# Patient Record
Sex: Male | Born: 1960 | Race: White | Hispanic: No | Marital: Married | Smoking: Never smoker
Health system: Southern US, Community
[De-identification: ages and names within clinical notes are randomized; demographics above are authoritative.]

## PROBLEM LIST (undated history)

## (undated) DIAGNOSIS — C801 Malignant (primary) neoplasm, unspecified: Secondary | ICD-10-CM

## (undated) DIAGNOSIS — R030 Elevated blood-pressure reading, without diagnosis of hypertension: Secondary | ICD-10-CM

## (undated) DIAGNOSIS — I10 Essential (primary) hypertension: Secondary | ICD-10-CM

## (undated) DIAGNOSIS — C4490 Unspecified malignant neoplasm of skin, unspecified: Secondary | ICD-10-CM

## (undated) HISTORY — DX: Essential (primary) hypertension: I10

## (undated) HISTORY — PX: KNEE ARTHROSCOPY W/ ACL RECONSTRUCTION: SHX1858

## (undated) HISTORY — DX: Unspecified malignant neoplasm of skin, unspecified: C44.90

## (undated) HISTORY — DX: Elevated blood-pressure reading, without diagnosis of hypertension: R03.0

## (undated) HISTORY — PX: ADENOIDECTOMY: SUR15

---

## 2012-02-08 ENCOUNTER — Ambulatory Visit (HOSPITAL_BASED_OUTPATIENT_CLINIC_OR_DEPARTMENT_OTHER)
Admission: RE | Admit: 2012-02-08 | Discharge: 2012-02-08 | Disposition: A | Discharge status: Home / Self Care | Payer: Self-pay | Source: Ambulatory Visit | Attending: Occupational Medicine | Admitting: Occupational Medicine

## 2012-02-08 ENCOUNTER — Other Ambulatory Visit: Payer: Self-pay | Admitting: Occupational Medicine

## 2012-02-08 DIAGNOSIS — Z0000 Encounter for general adult medical examination without abnormal findings: Secondary | ICD-10-CM | POA: Insufficient documentation

## 2013-01-02 DIAGNOSIS — M179 Osteoarthritis of knee, unspecified: Secondary | ICD-10-CM

## 2013-01-02 HISTORY — DX: Osteoarthritis of knee, unspecified: M17.9

## 2013-09-13 ENCOUNTER — Other Ambulatory Visit: Payer: Self-pay | Admitting: Sports Medicine

## 2013-09-13 DIAGNOSIS — M25519 Pain in unspecified shoulder: Secondary | ICD-10-CM

## 2013-09-18 ENCOUNTER — Other Ambulatory Visit: Payer: Self-pay

## 2013-09-19 ENCOUNTER — Other Ambulatory Visit: Payer: Self-pay | Admitting: Sports Medicine

## 2013-09-19 ENCOUNTER — Ambulatory Visit
Admission: RE | Admit: 2013-09-19 | Discharge: 2013-09-19 | Disposition: A | Discharge status: Home / Self Care | Payer: Managed Care, Other (non HMO) | Source: Ambulatory Visit | Attending: Sports Medicine | Admitting: Sports Medicine

## 2013-09-19 DIAGNOSIS — M25519 Pain in unspecified shoulder: Secondary | ICD-10-CM

## 2013-10-20 ENCOUNTER — Other Ambulatory Visit: Payer: Self-pay | Admitting: Sports Medicine

## 2013-10-20 DIAGNOSIS — M25519 Pain in unspecified shoulder: Secondary | ICD-10-CM

## 2013-10-27 ENCOUNTER — Ambulatory Visit
Admission: RE | Admit: 2013-10-27 | Discharge: 2013-10-27 | Disposition: A | Discharge status: Home / Self Care | Payer: Managed Care, Other (non HMO) | Source: Ambulatory Visit | Attending: Sports Medicine | Admitting: Sports Medicine

## 2013-10-27 DIAGNOSIS — M25519 Pain in unspecified shoulder: Secondary | ICD-10-CM

## 2013-10-27 MED ORDER — METHYLPREDNISOLONE ACETATE 40 MG/ML INJ SUSP (RADIOLOG
120.0000 mg | Freq: Once | INTRAMUSCULAR | Status: AC
  Administered 2013-10-27: 120 mg via INTRA_ARTICULAR

## 2013-10-27 MED ORDER — IOHEXOL 180 MG/ML  SOLN
1.0000 mL | Freq: Once | INTRAMUSCULAR | Status: AC | PRN
  Administered 2013-10-27: 1 mL via INTRA_ARTICULAR

## 2014-04-01 ENCOUNTER — Emergency Department (HOSPITAL_BASED_OUTPATIENT_CLINIC_OR_DEPARTMENT_OTHER): Payer: Managed Care, Other (non HMO)

## 2014-04-01 ENCOUNTER — Emergency Department (HOSPITAL_BASED_OUTPATIENT_CLINIC_OR_DEPARTMENT_OTHER)
Admission: EM | Admit: 2014-04-01 | Discharge: 2014-04-01 | Disposition: A | Discharge status: Home / Self Care | Payer: Managed Care, Other (non HMO) | Attending: Emergency Medicine | Admitting: Emergency Medicine

## 2014-04-01 ENCOUNTER — Encounter (HOSPITAL_BASED_OUTPATIENT_CLINIC_OR_DEPARTMENT_OTHER): Payer: Self-pay | Admitting: *Deleted

## 2014-04-01 DIAGNOSIS — M2550 Pain in unspecified joint: Secondary | ICD-10-CM | POA: Diagnosis not present

## 2014-04-01 DIAGNOSIS — R6883 Chills (without fever): Secondary | ICD-10-CM | POA: Diagnosis not present

## 2014-04-01 DIAGNOSIS — R0789 Other chest pain: Secondary | ICD-10-CM | POA: Insufficient documentation

## 2014-04-01 DIAGNOSIS — R42 Dizziness and giddiness: Secondary | ICD-10-CM | POA: Diagnosis not present

## 2014-04-01 DIAGNOSIS — R110 Nausea: Secondary | ICD-10-CM | POA: Insufficient documentation

## 2014-04-01 DIAGNOSIS — R011 Cardiac murmur, unspecified: Secondary | ICD-10-CM | POA: Insufficient documentation

## 2014-04-01 DIAGNOSIS — Z880 Allergy status to penicillin: Secondary | ICD-10-CM | POA: Insufficient documentation

## 2014-04-01 DIAGNOSIS — M546 Pain in thoracic spine: Secondary | ICD-10-CM | POA: Diagnosis not present

## 2014-04-01 DIAGNOSIS — R202 Paresthesia of skin: Secondary | ICD-10-CM | POA: Diagnosis not present

## 2014-04-01 DIAGNOSIS — R079 Chest pain, unspecified: Secondary | ICD-10-CM | POA: Diagnosis present

## 2014-04-01 LAB — URINALYSIS, ROUTINE W REFLEX MICROSCOPIC
Bilirubin Urine: NEGATIVE
Glucose, UA: NEGATIVE mg/dL
HGB URINE DIPSTICK: NEGATIVE
KETONES UR: NEGATIVE mg/dL
Leukocytes, UA: NEGATIVE
Nitrite: NEGATIVE
PH: 5.5 (ref 5.0–8.0)
PROTEIN: NEGATIVE mg/dL
Specific Gravity, Urine: 1.025 (ref 1.005–1.030)
Urobilinogen, UA: 1 mg/dL (ref 0.0–1.0)

## 2014-04-01 LAB — COMPREHENSIVE METABOLIC PANEL
ALT: 15 U/L (ref 0–53)
AST: 21 U/L (ref 0–37)
Albumin: 4.2 g/dL (ref 3.5–5.2)
Alkaline Phosphatase: 59 U/L (ref 39–117)
Anion gap: 16 — ABNORMAL HIGH (ref 5–15)
BUN: 21 mg/dL (ref 6–23)
CALCIUM: 9.3 mg/dL (ref 8.4–10.5)
CHLORIDE: 101 meq/L (ref 96–112)
CO2: 23 mEq/L (ref 19–32)
Creatinine, Ser: 1.1 mg/dL (ref 0.50–1.35)
GFR calc Af Amer: 87 mL/min — ABNORMAL LOW (ref >90)
GFR, EST NON AFRICAN AMERICAN: 75 mL/min — AB (ref >90)
Glucose, Bld: 115 mg/dL — ABNORMAL HIGH (ref 70–99)
Potassium: 4.2 mEq/L (ref 3.7–5.3)
SODIUM: 140 meq/L (ref 137–147)
Total Bilirubin: 0.7 mg/dL (ref 0.3–1.2)
Total Protein: 7.2 g/dL (ref 6.0–8.3)

## 2014-04-01 LAB — CBC
HCT: 41.7 % (ref 39.0–52.0)
HEMOGLOBIN: 14.4 g/dL (ref 13.0–17.0)
MCH: 31.8 pg (ref 26.0–34.0)
MCHC: 34.5 g/dL (ref 30.0–36.0)
MCV: 92.1 fL (ref 78.0–100.0)
PLATELETS: 155 10*3/uL (ref 150–400)
RBC: 4.53 MIL/uL (ref 4.22–5.81)
RDW: 11.9 % (ref 11.5–15.5)
WBC: 5.7 10*3/uL (ref 4.0–10.5)

## 2014-04-01 LAB — TROPONIN I: Troponin I: <0.3 ng/mL (ref <0.30)

## 2014-04-01 MED ORDER — ASPIRIN 81 MG PO CHEW
324.0000 mg | CHEWABLE_TABLET | Freq: Once | ORAL | Status: AC
  Administered 2014-04-01: 324 mg via ORAL
  Filled 2014-04-01: qty 4

## 2014-04-01 MED ORDER — LORAZEPAM 1 MG PO TABS
1.0000 mg | ORAL_TABLET | Freq: Once | ORAL | Status: AC
  Administered 2014-04-01: 1 mg via ORAL
  Filled 2014-04-01: qty 1

## 2014-04-01 NOTE — Discharge Instructions (Signed)
Chest Pain (Nonspecific) Call [facility] tomorrow to schedule the next available office appointment. Tell office staff that you were seen here tonight. It is often hard to give a diagnosis for the cause of chest pain. There is always a chance that your pain could be related to something serious, such as a heart attack or a blood clot in the lungs. You need to follow up with your doctor. HOME CARE  If antibiotic medicine was given, take it as directed by your doctor. Finish the medicine even if you start to feel better.  For the next few days, avoid activities that bring on chest pain. Continue physical activities as told by your doctor.  Do not use any tobacco products. This includes cigarettes, chewing tobacco, and e-cigarettes.  Avoid drinking alcohol.  Only take medicine as told by your doctor.  Follow your doctor's suggestions for more testing if your chest pain does not go away.  Keep all doctor visits you made. GET HELP IF:  Your chest pain does not go away, even after treatment.  You have a rash with blisters on your chest.  You have a fever. GET HELP RIGHT AWAY IF:   You have more pain or pain that spreads to your arm, neck, jaw, back, or belly (abdomen).  You have shortness of breath.  You cough more than usual or cough up blood.  You have very bad back or belly pain.  You feel sick to your stomach (nauseous) or throw up (vomit).  You have very bad weakness.  You pass out (faint).  You have chills. This is an emergency. Do not wait to see if the problems will go away. Call your local emergency services (911 in [place] Do not drive yourself to the hospital. MAKE SURE YOU:   Understand these instructions.  Will watch your condition.  Will get help right away if you are not doing well or get worse. Document Released: 11/04/2007 Document Revised: 05/23/2013 Document Reviewed: 11/04/2007 [facility] Patient Information 2015 [place], [place]. This  information is not intended to replace advice given to you by your health care provider. Make sure you discuss any questions you have with your health care provider.

## 2014-04-01 NOTE — ED Notes (Signed)
Last night began having chest heaviness, pressure that radiates down both arms, tightness in his back. Slight nausea and lightheadedness.

## 2014-04-01 NOTE — ED Notes (Signed)
Patient not yet able to urinate.

## 2014-04-01 NOTE — ED Provider Notes (Signed)
CSN: [id number]     Arrival date & time 04/01/14  1818 History  This chart was scribed for Orlie Dakin, MD by Tula Nakayama, ED Scribe. This patient was seen in room MH10/MH10 and the patient's care was started at 6:50 PM.     Chief Complaint  Patient presents with  . Chest Pain   The history is provided by the patient. No language interpreter was used.    HPI Comments: Benjamin Mccullough is a 53 y.o. male who presents to the Emergency Department complaining of constant, gradually onset chest heaviness and back pain between his shoulder bladeswith tingling in both arms started 10 hours ago. Pt states he lay down and measured BP several times, with readings higher than his baseline ranging around 160/110. He reports nausea, light-headedness, chills, and tingling in his arms as associated symptoms.discomfort is nonexertional pressure-like constant and mild. Pt notes that current symptoms are similar to prior flu symptoms. He tried rest with no relief. 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 He states he is normally active and that he felt more sore than usual after biking yesterday. He felt that all of his muscles ached. Muscle aches worse with movement. Pt has history of heart murmur. He has no family history of CAD <55 y/o and denies cardiac risk factors. Pt denies smoking, drug abuse, and drinking EtOH. He denies fever and SOB as associated symptoms. Cardiac risk factors none History reviewed. No pertinent past medical history. Past Surgical History  Procedure Laterality Date  . Knee arthroscopy w/ acl reconstruction     No family history on file. History  Substance Use Topics  . Smoking status: Never Smoker   . Smokeless tobacco: Not on file  . Alcohol Use: No    Review of Systems  Constitutional: Positive for chills. Negative for fever, diaphoresis, appetite change and fatigue.  HENT: Negative.  Negative for mouth sores, sore throat and trouble  swallowing.   Eyes: Negative for visual disturbance.  Respiratory: Negative.  Negative for cough, chest tightness, shortness of breath and wheezing.   Cardiovascular: Positive for chest pain.  Gastrointestinal: Positive for nausea. Negative for vomiting, abdominal pain, diarrhea and abdominal distention.  Endocrine: Negative for polydipsia, polyphagia and polyuria.  Genitourinary: Negative for dysuria, frequency and hematuria.  Musculoskeletal: Positive for back pain and arthralgias. Negative for gait problem.  Skin: Negative.  Negative for color change, pallor and rash.  Neurological: Positive for light-headedness. Negative for dizziness, syncope and headaches.  Hematological: Does not bruise/bleed easily.  Psychiatric/Behavioral: Negative.  Negative for behavioral problems and confusion.  All other systems reviewed and are negative.     Allergies  Penicillins  Home Medications   Prior to Admission medications   Not on File   BP 158/90 mmHg  Pulse 95  Temp(Src) 99.3 F (37.4 C) (Oral)  Resp 20  Ht 6\' 2"  (1.88 m)  Wt 195 lb (88.451 kg)  BMI 25.03 kg/m2  SpO2 99% Physical Exam  Constitutional: He appears well-developed and well-nourished.  HENT:  Head: Normocephalic and atraumatic.  Eyes: Conjunctivae are normal. Pupils are equal, round, and reactive to light.  Neck: Neck supple. No tracheal deviation present. No thyromegaly present.  Cardiovascular: Normal rate and regular rhythm.   No murmur heard. Pulmonary/Chest: Effort normal and breath sounds normal.  Abdominal: Soft. Bowel sounds are normal. He exhibits no distension. There is no tenderness.  Musculoskeletal: Normal range of motion. He exhibits no edema or tenderness.  Neurological: He is alert. Coordination normal.  Skin: Skin is warm and dry. No rash noted.  Psychiatric: He has a normal mood and affect.  Nursing note and vitals reviewed.   ED Course  Procedures (including critical care time) DIAGNOSTIC  STUDIES: Oxygen Saturation is 99% on RA, normal by my interpretation.    COORDINATION OF CARE: 7:06 PM Discussed treatment plan with pt at bedside and pt agreed to plan.  Labs Review Labs Reviewed - No data to display  Imaging Review No results found.   EKG Interpretation   Date/Time:  Sunday April 01 2014 18:30:10 EST Ventricular Rate:  86 PR Interval:  144 QRS Duration: 100 QT Interval:  378 QTC Calculation: 452 R Axis:   86 Text Interpretation:  Normal sinus rhythm Incomplete right bundle branch  block Borderline ECG No old tracing to compare Confirmed by Winfred Leeds   MD, Shanik Brookshire [phone]) on 04/01/2014 6:46:53 PM     Chest x-ray viewed by me. Results for orders placed or performed during the hospital encounter of 04/01/14  CBC  Result Value Ref Range   WBC 5.7 4.0 - 10.5 K/uL   RBC 4.53 4.22 - 5.81 MIL/uL   Hemoglobin 14.4 13.0 - 17.0 g/dL   HCT 41.7 39.0 - 52.0 %   MCV 92.1 78.0 - 100.0 fL   MCH 31.8 26.0 - 34.0 pg   MCHC 34.5 30.0 - 36.0 g/dL   RDW 11.9 11.5 - 15.5 %   Platelets 155 150 - 400 K/uL  Comprehensive metabolic panel  Result Value Ref Range   Sodium 140 137 - 147 mEq/L   Potassium 4.2 3.7 - 5.3 mEq/L   Chloride 101 96 - 112 mEq/L   CO2 23 19 - 32 mEq/L   Glucose, Bld 115 (H) 70 - 99 mg/dL   BUN 21 6 - 23 mg/dL   Creatinine, Ser 1.10 0.50 - 1.35 mg/dL   Calcium 9.3 8.4 - 10.5 mg/dL   Total Protein 7.2 6.0 - 8.3 g/dL   Albumin 4.2 3.5 - 5.2 g/dL   AST 21 0 - 37 U/L   ALT 15 0 - 53 U/L   Alkaline Phosphatase 59 39 - 117 U/L   Total Bilirubin 0.7 0.3 - 1.2 mg/dL   GFR calc non Af Amer 75 (L) >90 mL/min   GFR calc Af Amer 87 (L) >90 mL/min   Anion gap 16 (H) 5 - 15  Troponin I (order at [facility])  Result Value Ref Range   Troponin I <0.30 <0.30 ng/mL  Urinalysis, Routine w reflex microscopic  Result Value Ref Range   Color, Urine YELLOW YELLOW   APPearance CLEAR CLEAR   Specific Gravity, Urine 1.025 1.005 - 1.030   pH 5.5 5.0 - 8.0   Glucose, UA  NEGATIVE NEGATIVE mg/dL   Hgb urine dipstick NEGATIVE NEGATIVE   Bilirubin Urine NEGATIVE NEGATIVE   Ketones, ur NEGATIVE NEGATIVE mg/dL   Protein, ur NEGATIVE NEGATIVE mg/dL   Urobilinogen, UA 1.0 0.0 - 1.0 mg/dL   Nitrite NEGATIVE NEGATIVE   Leukocytes, UA NEGATIVE NEGATIVE   Dg Chest 2 View  04/01/2014   CLINICAL DATA:  Initial encounter. Chest heaviness. Chest pressure.  EXAM: CHEST  2 VIEW  COMPARISON:  02/08/2012.  FINDINGS: Cardiopericardial silhouette within normal limits. Mediastinal contours normal. Trachea midline. No airspace disease or effusion. Monitoring leads project over the chest. Azygos fissure incidentally noted.  IMPRESSION: No active cardiopulmonary disease.   Electronically Signed   By: Arvilla Market.D.  On: 04/01/2014 19:38    MDM  Heart score equal 1-2 a stone history, age.low pretest clinical suspicion for acute coronary syndrome Hospitalization offer to patient which he declines after lengthy discussion with patient and his wife. Patient is suitable for outpatient cardiac evaluation Final diagnoses:  None   Plan referral [facility] Diagnosis atypical chest pain       Orlie Dakin, MD 04/01/14 2059

## 2014-04-03 NOTE — Progress Notes (Signed)
     HPI: 53 yo male for evaluation of chest pain. Seen in ER 04/01/14 with chest pain; chest xray negative; Hgb, K, renal function, LFTs and troponin negative. Patient is very active. He runs routinely and rides them out and bike. He does not have chest pain with these. 3 days ago he developed chest tightness that lasted all day and radiated to his back. Not pleuritic, positional or related to food. He also had chills, nausea. He went to the emergency room for evaluation with the tests as described above. He ultimately had complete resolution after diarrhea. No chest pain since. He denies dyspnea.  No current outpatient prescriptions on file.   No current facility-administered medications for this visit.    Allergies  Allergen Reactions  . Penicillins      Past Medical History  Diagnosis Date  . Borderline hypertension     Past Surgical History  Procedure Laterality Date  . Knee arthroscopy w/ acl reconstruction    . Adenoidectomy      History   Social History  . Marital Status: Married    Spouse Name: N/A    Number of Children: 2  . Years of Education: N/A   Occupational History  .      Fire department   Social History Main Topics  . Smoking status: Never Smoker   . Smokeless tobacco: Not on file  . Alcohol Use: 0.0 oz/week    0 Not specified per week     Comment: Rare  . Drug Use: No  . Sexual Activity: Not on file   Other Topics Concern  . Not on file   Social History Narrative    Family History  Problem Relation Age of Onset  . CAD Father     CABG in his 37s  . CAD Mother     Stents in her 84s    ROS: Recent chills, nausea and diarrhea that have now resolved. no fevers, productive cough, hemoptysis, dysphasia, odynophagia, melena, hematochezia, dysuria, hematuria, rash, seizure activity, orthopnea, PND, pedal edema, claudication. Remaining systems are negative.  Physical Exam:   Blood pressure 144/88, pulse 76, height 6\' 2"  (1.88 m), weight 204 lb  (92.534 kg).  General:  Well developed/well nourished in NAD Skin warm/dry Patient not depressed No peripheral clubbing Back-normal HEENT-normal/normal eyelids Neck supple/normal carotid upstroke bilaterally; no bruits; no JVD; no thyromegaly chest - CTA/ normal expansion CV - RRR/normal S1 and S2; no murmurs, rubs or gallops;  PMI nondisplaced Abdomen -NT/ND, no HSM, no mass, + bowel sounds, no bruit 2+ femoral pulses, no bruits Ext-no edema, chords, 2+ DP Neuro-grossly nonfocal  ECG 04/01/2014-sinus rhythm, right axis deviation, incomplete right bundle branch block.

## 2014-04-04 ENCOUNTER — Ambulatory Visit (INDEPENDENT_AMBULATORY_CARE_PROVIDER_SITE_OTHER): Payer: Managed Care, Other (non HMO) | Admitting: Cardiology

## 2014-04-04 ENCOUNTER — Encounter: Payer: Self-pay | Admitting: *Deleted

## 2014-04-04 ENCOUNTER — Encounter: Payer: Self-pay | Admitting: Cardiology

## 2014-04-04 VITALS — BP 144/88 | HR 76 | Ht 74.0 in | Wt 204.0 lb

## 2014-04-04 DIAGNOSIS — R079 Chest pain, unspecified: Secondary | ICD-10-CM

## 2014-04-04 DIAGNOSIS — R030 Elevated blood-pressure reading, without diagnosis of hypertension: Secondary | ICD-10-CM

## 2014-04-04 DIAGNOSIS — IMO0001 Reserved for inherently not codable concepts without codable children: Secondary | ICD-10-CM | POA: Insufficient documentation

## 2014-04-04 DIAGNOSIS — R072 Precordial pain: Secondary | ICD-10-CM

## 2014-04-04 HISTORY — DX: Chest pain, unspecified: R07.9

## 2014-04-04 NOTE — Assessment & Plan Note (Signed)
Blood pressure borderline at 144/88. I have asked him to follow this and if it remains elevated he may need medication in the future. Follow-up primary care.

## 2014-04-04 NOTE — Patient Instructions (Signed)
Your physician recommends that you schedule a follow-up appointment in: AS NEEDED PENDING TEST RESULTS  Your physician has requested that you have an echocardiogram. Echocardiography is a painless test that uses sound waves to create images of your heart. It provides your doctor with information about the size and shape of your heart and how well your heart's chambers and valves are working. This procedure takes approximately one hour. There are no restrictions for this procedure.   Your physician has requested that you have an exercise tolerance test. For further information please visit [email or web address]. Please also follow instruction sheet, as given.    Exercise Stress Electrocardiogram An exercise stress electrocardiogram is a test to check how blood flows to your heart. It is done to find areas of poor blood flow. You will need to walk on a treadmill for this test. The electrocardiogram will record your heartbeat when you are at rest and when you are exercising. BEFORE THE PROCEDURE  Do not have drinks with caffeine or foods with caffeine for 24 hours before the test, or as told by your doctor. This includes coffee, tea (even decaf tea), sodas, chocolate, and cocoa.  Follow your doctor's instructions about eating and drinking before the test.  Ask your doctor what medicines you should or should not take before the test. Take your medicines with water unless told by your doctor not to.  If you use an inhaler, bring it with you to the test.  Bring a snack to eat after the test.  Do not  smoke for 4 hours before the test.  Do not put lotions, powders, creams, or oils on your chest before the test.  Wear comfortable shoes and clothing. PROCEDURE  You will have patches put on your chest. Small areas of your chest may need to be shaved. Wires will be connected to the patches.  Your heart rate will be watched while you are resting and while you are exercising.  You will walk on the  treadmill. The treadmill will slowly get faster to raise your heart rate.  The test will take about 1-2 hours. AFTER THE PROCEDURE  Your heart rate and blood pressure will be watched after the test.  You may return to your normal diet, activities, and medicines or as told by your doctor. Document Released: 11/04/2007 Document Revised: 10/02/2013 Document Reviewed: 01/23/2013 [employer] Patient Information 2015 [employer]. This information is not intended to replace advice given to you by your health care provider. Make sure you discuss any questions you have with your health care provider.   

## 2014-04-04 NOTE — Assessment & Plan Note (Signed)
Symptoms are atypical. They have resolved. He may have had a viral syndrome. He is a Airline pilot. I will arrange an exercise treadmill for risk stratification. Echocardiogram to assess wall motion abnormalities.

## 2014-04-20 ENCOUNTER — Telehealth (HOSPITAL_COMMUNITY): Payer: Self-pay

## 2014-04-20 NOTE — Telephone Encounter (Signed)
Encounter complete. 

## 2014-04-25 ENCOUNTER — Ambulatory Visit (HOSPITAL_BASED_OUTPATIENT_CLINIC_OR_DEPARTMENT_OTHER)
Admission: RE | Admit: 2014-04-25 | Discharge: 2014-04-25 | Disposition: A | Discharge status: Home / Self Care | Payer: Managed Care, Other (non HMO) | Source: Ambulatory Visit | Attending: Internal Medicine | Admitting: Internal Medicine

## 2014-04-25 ENCOUNTER — Ambulatory Visit (HOSPITAL_COMMUNITY)
Admission: RE | Admit: 2014-04-25 | Discharge: 2014-04-25 | Disposition: A | Discharge status: Home / Self Care | Payer: Managed Care, Other (non HMO) | Source: Ambulatory Visit | Attending: Internal Medicine | Admitting: Internal Medicine

## 2014-04-25 DIAGNOSIS — R079 Chest pain, unspecified: Secondary | ICD-10-CM

## 2014-04-25 DIAGNOSIS — Z8249 Family history of ischemic heart disease and other diseases of the circulatory system: Secondary | ICD-10-CM | POA: Diagnosis not present

## 2014-04-25 NOTE — Progress Notes (Signed)
2D Echocardiogram Complete.  04/25/2014   Seren Chaloux [place], RDCS

## 2014-04-25 NOTE — Procedures (Signed)
Exercise Treadmill Test  Test  Exercise Tolerance Test Ordering MD: Kirk Ruths, MD    Unique Test No: 1  Treadmill:  1  Indication for ETT: chest pain - rule out ischemia  Contraindication to ETT: No   Stress Modality: exercise - treadmill  Cardiac Imaging Performed: non   Protocol: standard Bruce - maximal  Max BP:  207/77  Max MPHR (bpm):  167 85% MPR (bpm):  141  MPHR obtained (bpm):  169 % MPHR obtained:  101  Reached 85% MPHR (min:sec):  9:40 Total Exercise Time (min-sec):  14  Workload in METS:  17.2 Borg Scale: 15  Reason ETT Terminated:  THR obtained, mild fatigue    ST Segment Analysis At Rest: normal ST segments - no evidence of significant ST depression With Exercise: no evidence of significant ST depression  Other Information Arrhythmia:  No Angina during ETT:  absent (0) Quality of ETT:  diagnostic  ETT Interpretation:  normal - no evidence of ischemia by ST analysis  Comments: Excellent exercise tolerance - 17 METS No evidence for ischemia.  Resting hypertensive with appropriate response to level of exercise.  Pixie Casino, MD, [facility] Attending Cardiologist [place]

## 2017-11-26 DIAGNOSIS — H16041 Marginal corneal ulcer, right eye: Secondary | ICD-10-CM | POA: Diagnosis not present

## 2017-11-29 DIAGNOSIS — H43813 Vitreous degeneration, bilateral: Secondary | ICD-10-CM | POA: Diagnosis not present

## 2017-11-29 DIAGNOSIS — H16041 Marginal corneal ulcer, right eye: Secondary | ICD-10-CM | POA: Diagnosis not present

## 2017-12-14 DIAGNOSIS — H1789 Other corneal scars and opacities: Secondary | ICD-10-CM | POA: Diagnosis not present

## 2018-11-25 DIAGNOSIS — K098 Other cysts of oral region, not elsewhere classified: Secondary | ICD-10-CM | POA: Diagnosis not present

## 2018-12-20 DIAGNOSIS — H1789 Other corneal scars and opacities: Secondary | ICD-10-CM | POA: Diagnosis not present

## 2018-12-20 DIAGNOSIS — H5212 Myopia, left eye: Secondary | ICD-10-CM | POA: Diagnosis not present

## 2019-01-25 DIAGNOSIS — L918 Other hypertrophic disorders of the skin: Secondary | ICD-10-CM | POA: Diagnosis not present

## 2019-01-25 DIAGNOSIS — L821 Other seborrheic keratosis: Secondary | ICD-10-CM | POA: Diagnosis not present

## 2019-01-25 DIAGNOSIS — L814 Other melanin hyperpigmentation: Secondary | ICD-10-CM | POA: Diagnosis not present

## 2019-01-25 DIAGNOSIS — L570 Actinic keratosis: Secondary | ICD-10-CM | POA: Diagnosis not present

## 2019-02-21 DIAGNOSIS — D3705 Neoplasm of uncertain behavior of pharynx: Secondary | ICD-10-CM | POA: Diagnosis not present

## 2019-02-21 DIAGNOSIS — R590 Localized enlarged lymph nodes: Secondary | ICD-10-CM | POA: Diagnosis not present

## 2019-02-21 DIAGNOSIS — J039 Acute tonsillitis, unspecified: Secondary | ICD-10-CM | POA: Diagnosis not present

## 2019-02-21 DIAGNOSIS — D487 Neoplasm of uncertain behavior of other specified sites: Secondary | ICD-10-CM | POA: Diagnosis not present

## 2019-03-03 ENCOUNTER — Other Ambulatory Visit: Payer: Self-pay | Admitting: Otolaryngology

## 2019-03-03 DIAGNOSIS — R221 Localized swelling, mass and lump, neck: Secondary | ICD-10-CM

## 2019-03-10 ENCOUNTER — Ambulatory Visit
Admission: RE | Admit: 2019-03-10 | Discharge: 2019-03-10 | Disposition: A | Discharge status: Home / Self Care | Payer: Managed Care, Other (non HMO) | Source: Ambulatory Visit | Attending: Otolaryngology | Admitting: Otolaryngology

## 2019-03-10 ENCOUNTER — Other Ambulatory Visit: Payer: Self-pay

## 2019-03-10 DIAGNOSIS — R221 Localized swelling, mass and lump, neck: Secondary | ICD-10-CM

## 2019-03-10 MED ORDER — IOPAMIDOL (ISOVUE-300) INJECTION 61%
75.0000 mL | Freq: Once | INTRAVENOUS | Status: AC | PRN
  Administered 2019-03-10: 75 mL via INTRAVENOUS

## 2019-03-14 DIAGNOSIS — D487 Neoplasm of uncertain behavior of other specified sites: Secondary | ICD-10-CM | POA: Diagnosis not present

## 2019-03-14 DIAGNOSIS — D3705 Neoplasm of uncertain behavior of pharynx: Secondary | ICD-10-CM | POA: Diagnosis not present

## 2019-03-14 DIAGNOSIS — R590 Localized enlarged lymph nodes: Secondary | ICD-10-CM | POA: Diagnosis not present

## 2019-03-14 DIAGNOSIS — C760 Malignant neoplasm of head, face and neck: Secondary | ICD-10-CM | POA: Diagnosis not present

## 2019-03-15 ENCOUNTER — Other Ambulatory Visit: Payer: Self-pay | Admitting: Otolaryngology

## 2019-03-15 ENCOUNTER — Other Ambulatory Visit (HOSPITAL_COMMUNITY): Payer: Self-pay | Admitting: Otolaryngology

## 2019-03-15 DIAGNOSIS — C7989 Secondary malignant neoplasm of other specified sites: Secondary | ICD-10-CM

## 2019-03-15 DIAGNOSIS — C801 Malignant (primary) neoplasm, unspecified: Secondary | ICD-10-CM

## 2019-03-16 ENCOUNTER — Encounter: Payer: Self-pay | Admitting: *Deleted

## 2019-03-16 ENCOUNTER — Telehealth: Payer: Self-pay | Admitting: Hematology

## 2019-03-16 NOTE — Progress Notes (Signed)
Received referral for possible metastatic cancer. Message sent to scheduler to schedule with Dr Maylon Peppers tomorrow.   Called [facility] to have p16 added to specimen [id number] DOS 03/14/19.  Spoke to client services and they advised that p16 testing is not possible as they only have smears and they require a cell block for p16 testing.  Dr Maylon Peppers notified.

## 2019-03-16 NOTE — Telephone Encounter (Signed)
Spoke with patient to confirm new patient appt date/time/location 10/16 at 0830 am

## 2019-03-17 ENCOUNTER — Inpatient Hospital Stay: Payer: BC Managed Care – PPO | Attending: Hematology | Admitting: Hematology

## 2019-03-17 ENCOUNTER — Other Ambulatory Visit: Payer: Self-pay

## 2019-03-17 ENCOUNTER — Other Ambulatory Visit: Payer: Self-pay | Admitting: Hematology

## 2019-03-17 ENCOUNTER — Encounter: Payer: Self-pay | Admitting: Hematology

## 2019-03-17 ENCOUNTER — Encounter: Payer: Self-pay | Admitting: *Deleted

## 2019-03-17 ENCOUNTER — Inpatient Hospital Stay: Payer: BC Managed Care – PPO

## 2019-03-17 VITALS — BP 171/111 | HR 80 | Temp 97.1°F | Resp 18 | Ht 74.0 in | Wt 205.0 lb

## 2019-03-17 DIAGNOSIS — C760 Malignant neoplasm of head, face and neck: Secondary | ICD-10-CM

## 2019-03-17 DIAGNOSIS — R1310 Dysphagia, unspecified: Secondary | ICD-10-CM | POA: Diagnosis not present

## 2019-03-17 DIAGNOSIS — E875 Hyperkalemia: Secondary | ICD-10-CM | POA: Insufficient documentation

## 2019-03-17 DIAGNOSIS — R221 Localized swelling, mass and lump, neck: Secondary | ICD-10-CM | POA: Insufficient documentation

## 2019-03-17 DIAGNOSIS — C4442 Squamous cell carcinoma of skin of scalp and neck: Secondary | ICD-10-CM | POA: Insufficient documentation

## 2019-03-17 HISTORY — DX: Squamous cell carcinoma of skin of scalp and neck: C44.42

## 2019-03-17 LAB — CMP (CANCER CENTER ONLY)
ALT: 21 U/L (ref 0–44)
AST: 22 U/L (ref 15–41)
Albumin: 4.9 g/dL (ref 3.5–5.0)
Alkaline Phosphatase: 67 U/L (ref 38–126)
Anion gap: 6 (ref 5–15)
BUN: 17 mg/dL (ref 6–20)
CO2: 28 mmol/L (ref 22–32)
Calcium: 9.9 mg/dL (ref 8.9–10.3)
Chloride: 105 mmol/L (ref 98–111)
Creatinine: 1.16 mg/dL (ref 0.61–1.24)
GFR, Est AFR Am: >60 mL/min (ref >60)
GFR, Estimated: >60 mL/min (ref >60)
Glucose, Bld: 122 mg/dL — ABNORMAL HIGH (ref 70–99)
Potassium: 5.4 mmol/L — ABNORMAL HIGH (ref 3.5–5.1)
Sodium: 139 mmol/L (ref 135–145)
Total Bilirubin: 0.6 mg/dL (ref 0.3–1.2)
Total Protein: 7.6 g/dL (ref 6.5–8.1)

## 2019-03-17 LAB — CBC WITH DIFFERENTIAL (CANCER CENTER ONLY)
Abs Immature Granulocytes: 0 10*3/uL (ref 0.00–0.07)
Basophils Absolute: 0 10*3/uL (ref 0.0–0.1)
Basophils Relative: 0 %
Eosinophils Absolute: 0.1 10*3/uL (ref 0.0–0.5)
Eosinophils Relative: 1 %
HCT: 42.1 % (ref 39.0–52.0)
Hemoglobin: 14.6 g/dL (ref 13.0–17.0)
Immature Granulocytes: 0 %
Lymphocytes Relative: 23 %
Lymphs Abs: 1.1 10*3/uL (ref 0.7–4.0)
MCH: 31 pg (ref 26.0–34.0)
MCHC: 34.7 g/dL (ref 30.0–36.0)
MCV: 89.4 fL (ref 80.0–100.0)
Monocytes Absolute: 0.3 10*3/uL (ref 0.1–1.0)
Monocytes Relative: 7 %
Neutro Abs: 3.4 10*3/uL (ref 1.7–7.7)
Neutrophils Relative %: 69 %
Platelet Count: 214 10*3/uL (ref 150–400)
RBC: 4.71 MIL/uL (ref 4.22–5.81)
RDW: 12.1 % (ref 11.5–15.5)
WBC Count: 4.9 10*3/uL (ref 4.0–10.5)
nRBC: 0 % (ref 0.0–0.2)

## 2019-03-17 MED ORDER — SODIUM POLYSTYRENE SULFONATE 15 GM/60ML PO SUSP: 15.0000 g | Freq: Every day | ORAL | 0 refills | Status: AC

## 2019-03-17 NOTE — Progress Notes (Signed)
Initial RN Navigator Patient Visit  Name: Benjamin Mccullough Date of Referral : 03/16/2019 Diagnosis: Squamous cell carcinoma of head and neck   Met with patient prior to their visit with MD. [name] patient "Your Patient Navigator" handout which explains my role, areas in which I am able to help, and all the contact information for myself and the office. Also gave patient MD and Navigator business card. Reviewed with patient the general overview of expected course after initial diagnosis and time frame for all steps to be completed.  New patient packet given to patient which includes: orientation to office and staff; campus directory; education on My Chart and Advance Directives; and patient centered education on Head and Neck Cancer. Patient doesn't have a PCP. Encouraged patient to establish care with PCP and information given   Patient care will predominantly be scheduled at the [facility] location. [name] notified of patient. Also explained to Benjamin Mccullough that [name] would be navigating him at the [facility] location.   Patient completed visit with Dr. Maylon Peppers  Revisited with patient after MD visit. Patient will need  PET Scan - already scheduled for 03/21/2019 Referral to Radiation Oncology - already initiated and scheduled  Patient understands all follow up procedures and expectations. They have my number to reach out for any further clarification or additional needs.

## 2019-03-17 NOTE — Progress Notes (Signed)
[name] CONSULT NOTE  Patient Care Team: Julianne Handler, MD as PCP - [place] Nakayama, MD as Consulting Physician (Hematology) Eppie Gibson, MD as Attending Physician (Radiation Oncology) Leota Sauers, RN as Oncology Nurse Navigator  HEME/ONC OVERVIEW: 1. Squamous cell carcinoma of left neck, primary unknown  -03/2019:   Left Level II cervical adenopathy, questionable left tonsil abnormality on CT  Left tonsil bx benign; squamous cell Ca from FNA of left cervical LN, insufficient tissue for HPV stain   TREATMENT REGIMEN:  TBD  ASSESSMENT & PLAN:   Squamous cell carcinoma of left neck, primary unknown  -I reviewed the patient's records in detail, including external ENT clinic notes, lab studies, imaging results, pathology reports -I also independently reviewed the radiologic images of recent CT neck, and agree with findings documented -In summary, patient was first seen by Dr. Benjamine Mccullough in 10/2018 for a nodule in the left tonsillar fossa that was noted incidentally during a dental exam.  The decision was made to monitor it closely at that time.  However, over the next 3 months, patient felt that the tonsillar nodule grew larger, and also developed an enlarging left neck mass.  Patient returned to Dr. Benjamine Mccullough in early 03/2019 for further evaluation.   Fiberoptic evaluation of the oral cavity, oropharynx, nasopharynx and larynx was unremarkable.  The small left tonsillar nodule remained unchanged.  CT neck in 03/2019 showed a left Level II LN 3.7 x 3.3cm w/ an additional adjacent LN ~1.4cm without any additional suspicious LN's.  There was some asymmetry in the left palatine tonsil.  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  Unfortunately, there was insufficient tissue for p16 stain.   -I discussed the imaging and pathology reports with the patient -I also reviewed NCCN guidelines at length with the patient -I  discussed with the patient that the treatment for head and neck cancer requires multidisciplinary approach, including ENT, radiation oncology, and medical oncology.  The specific treatment is based on individual disease characteristics, as well as patient's medical comorbidities. -As he does not have a clear primary malignancy, I have ordered PET scan to assess any primary malignancy and rule out metastatic disease (currently scheduled on 03/21/2019) -I briefly discussed with the patient that given the relatively isolated disease in the left cervical LN's, he may be a candidate for upfront surgical resection, and based on the pathology, we can determine if he requires any adjuvant treatment, such as radiation vs chemoradiation  -If he does not have any evidence of ECE on the neck dissection, he may only require adjuvant RT without chemotherapy -I have also requested the case to be reviewed at the H&N tumor board   Hyperkalemia -Likely due to increased dietary intake  -I counseled the patient on the importance of moderation on diet that contains high potassium -I have also prescribed Kayexalate 15g daily x 3 days   Orders Placed This Encounter  Procedures  . CBC with Differential (Cancer Center Only)    Standing Status:   Future    Standing Expiration Date:   04/20/2020  . CMP ([facility] only)    Standing Status:   Future    Standing Expiration Date:   04/20/2020  . Ambulatory referral to Radiation Oncology    Referral Priority:   Routine    Referral Type:   Consultation    Referral Reason:   Specialty Services Required    Requested Specialty:  Radiation Oncology    Number of Visits Requested:   1   All questions were answered. The patient knows to call the clinic with any problems, questions or concerns.  Tentatively return in 2 weeks for labs and clinic follow-up.   Tish Men, MD 03/17/2019 11:09 AM   CHIEF COMPLAINTS/PURPOSE OF CONSULTATION:  "I am doing fine"  HISTORY OF  PRESENTING ILLNESS:  Benjamin Mccullough 57 y.o. 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  Patient was first seen by Dr. Benjamine Mccullough in 10/2018 for a nodule in the left tonsillar fossa that was noted incidentally during a dental exam.  The decision was made to monitor it closely at that time.  However, over the next 3 months, patient felt that the tonsillar nodule grew larger, and also developed an enlarging left neck mass.  Patient returned to Dr. Benjamine Mccullough in early 03/2019 for further evaluation.   Fiberoptic evaluation of the oral cavity, oropharynx, nasopharynx and larynx was unremarkable.  The small left tonsillar nodule remained unchanged.  CT neck in 03/2019 showed a left Level II LN 3.7 x 3.3cm w/ an additional adjacent LN ~1.4cm without any additional suspicious LN's.  There was some asymmetry in the left palatine tonsil.  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  Unfortunately, there was insufficient tissue for p16 stain.    Patient reports that he has occasional dysphagia with liquids, but is otherwise able to eat and drink without any limitation most of the time.  He also reports occasional posterior headache without any associated neurologic deficits, and he attributes to not drinking of caffeine.  He retired as a Airline pilot for 30 years in 2018.  He does not smoke drink or use illicit drugs.  He denies any complaint today.  REVIEW OF SYSTEMS:   Constitutional: ( - ) fevers, ( - )  chills , ( - ) night sweats Eyes: ( - ) blurriness of vision, ( - ) double vision, ( - ) watery eyes Ears, nose, mouth, throat, and face: ( - ) mucositis, ( - ) sore throat Respiratory: ( - ) cough, ( - ) dyspnea, ( - ) wheezes Cardiovascular: ( - ) palpitation, ( - ) chest discomfort, ( - ) lower extremity swelling Gastrointestinal:  ( - ) nausea, ( - ) heartburn, ( - ) change in bowel habits Skin: ( - ) abnormal skin  rashes Lymphatics: ( + ) lymphadenopathy, ( - ) easy bruising Neurological: ( - ) numbness, ( - ) tingling, ( - ) new weaknesses Behavioral/Psych: ( - ) mood change, ( - ) new changes  All other systems were reviewed with the patient and are negative.  I have reviewed his chart and materials related to his cancer extensively and collaborated history with the patient. Summary of oncologic history is as follows: Oncology History   No history exists.    MEDICAL HISTORY:  Past Medical History:  Diagnosis Date  . Borderline hypertension     SURGICAL HISTORY: Past Surgical History:  Procedure Laterality Date  . ADENOIDECTOMY    . KNEE ARTHROSCOPY W/ ACL RECONSTRUCTION      SOCIAL HISTORY: Social History   Socioeconomic History  . Marital status: Married    Spouse name: Not on file  . Number of children: 2  . Years of education: Not on file  . Highest education level: Not on file  Occupational History  Comment: Fire department  Social Needs  . Financial resource strain: Not on file  . Food insecurity    Worry: Not on file    Inability: Not on file  . Transportation needs    Medical: Not on file    Non-medical: Not on file  Tobacco Use  . Smoking status: Never Smoker  . Smokeless tobacco: Never Used  Substance and Sexual Activity  . Alcohol use: Yes    Alcohol/week: 0.0 standard drinks    Comment: Rare  . Drug use: No  . Sexual activity: Not on file  Lifestyle  . Physical activity    Days per week: Not on file    Minutes per session: Not on file  . Stress: Not on file  Relationships  . Social Herbalist on phone: Not on file    Gets together: Not on file    Attends religious service: Not on file    Active member of club or organization: Not on file    Attends meetings of clubs or organizations: Not on file    Relationship status: Not on file  . Intimate partner violence    Fear of current or ex partner: Not on file    Emotionally abused: Not on  file    Physically abused: Not on file    Forced sexual activity: Not on file  Other Topics Concern  . Not on file  Social History Narrative  . Not on file    FAMILY HISTORY: Family History  Problem Relation Age of Onset  . CAD Father        CABG in his 75s  . CAD Mother        Stents in her 79s    ALLERGIES:  is allergic to penicillins.  MEDICATIONS:  Current Outpatient Medications  Medication Sig Dispense Refill  . sodium polystyrene (KAYEXALATE) 15 GM/60ML suspension Take 60 mLs (15 g total) by mouth daily for 3 days. 180 mL 0   No current facility-administered medications for this visit.     PHYSICAL EXAMINATION: ECOG PERFORMANCE STATUS: 0 - Asymptomatic  Vitals:   03/17/19 0919  BP: (!) 171/111  Pulse: 80  Resp: 18  Temp: (!) 97.1 F (36.2 C)  SpO2: 100%   Filed Weights   03/17/19 0919  Weight: 205 lb (93 kg)    GENERAL: alert, no distress and comfortable SKIN: skin color, texture, turgor are normal, no rashes or significant lesions EYES: conjunctiva are pink and non-injected, sclera clear OROPHARYNX: slight left tonsil fullness but no distinct ulceration noted  NECK: supple, non-tender LYMPH:  Left Level II cervical adenopathy, measuring ~4cm LUNGS: clear to auscultation with normal breathing effort HEART: regular rate & rhythm, no murmurs, no lower extremity edema ABDOMEN: soft, non-tender, non-distended, normal bowel sounds Musculoskeletal: no cyanosis of digits and no clubbing  PSYCH: alert & oriented x 3, fluent speech NEURO: no focal motor/sensory deficits  LABORATORY DATA:  I have reviewed the data as listed Lab Results  Component Value Date   WBC 4.9 03/17/2019   HGB 14.6 03/17/2019   HCT 42.1 03/17/2019   MCV 89.4 03/17/2019   PLT 214 03/17/2019   Lab Results  Component Value Date   NA 139 03/17/2019   K 5.4 (H) 03/17/2019   CL 105 03/17/2019   CO2 28 03/17/2019    RADIOGRAPHIC STUDIES: I have personally reviewed the  radiological images as listed and agreed with the findings in the report. Ct Soft Tissue Neck W  Contrast  Result Date: 03/10/2019 CLINICAL DATA:  Neck mass. Additional history provided: Left submandibular swelling that has improved since patient for saw Dr.Teoh. Onset about 5 months ago. Biopsy a peritonsillar mass that was benign. EXAM: CT NECK WITH CONTRAST TECHNIQUE: Multidetector CT imaging of the neck was performed using the standard protocol following the bolus administration of intravenous contrast. CONTRAST:  61mL ISOVUE-300 IOPAMIDOL (ISOVUE-300) INJECTION 61% COMPARISON:  No pertinent prior studies available for comparison. FINDINGS: Pharynx and larynx: There is asymmetric soft tissue prominence in the region of the left palatine tonsil (series 3, image 41). No significant mass or swelling within the nasopharynx, hypopharynx or larynx. Salivary glands: No evidence of inflammation, mass or stone. Thyroid: Negative Lymph nodes: Enlarged left level II lymph node with internal regions of low attenuation, measuring 3.7 x 3.3 cm (series 6, image 71). Immediately posterior to this there is an additional enlarged left level II lymph node which measures 1.4 cm in short axis and demonstrates central low attenuation. No other pathologically enlarged cervical chain lymph nodes are identified. Vascular: The major vascular structures of the neck appear patent. Limited intracranial: Unremarkable Visualized orbits: Excluded from the field of view Mastoids and visualized paranasal sinuses: Small mucous retention cyst within the inferior left maxillary sinus. No significant mastoid effusion Skeleton: Cervical spondylosis. This includes moderate/advanced C5-C6 and C6-C7 disc height loss with small posterior disc osteophytes at this level. Multilevel uncovertebral and facet hypertrophy also greatest at these levels. No acute bony abnormality or suspicious osseous lesion. Upper chest: No consolidation within the imaged lung  apices. Incidentally noted azygos fissure. Ectatic visualized ascending aorta measuring 3.9 cm in diameter. These results will be called to the ordering clinician or representative by the Radiologist Assistant, and communication documented in the PACS or zVision Dashboard. IMPRESSION: 1. Prominent left level II lymphadenopathy with lymph nodes measuring up to 3.7 x 3.3 cm. These nodes demonstrate internal regions of low attenuation and are highly suspicious for nodal metastatic disease. Consider direct tissue sampling for further evaluation. 2. Asymmetric soft tissue prominence in the region of the left palatine tonsil suspicious for possible primary mass given the presence of left level II lymphadenopathy. Correlate with direct visualization. 3. Ectatic visualized ascending aorta measuring 3.9 cm in diameter. Electronically Signed   By: Kellie Simmering   On: 03/10/2019 20:08    PATHOLOGY: I have reviewed the pathology reports as documented in the oncologist history.

## 2019-03-20 ENCOUNTER — Encounter: Payer: Self-pay | Admitting: Radiation Oncology

## 2019-03-20 NOTE — Progress Notes (Signed)
Head and Neck Cancer Location of Tumor / Histology:  Left cervical LN FNA showed squamous cell carcinoma completed by Dr. Benjamine Mola. There was insufficient tissue for p16 stain.   Patient presented with symptoms of: a nodule in the left tonsillar fossa noted during a dental exam in 10/2018. He was referred to Dr. Benjamine Mola who monitored it closely for 3 months until the patient noted the nodule growing larger and he developed an enlarging neck mass.   Biopsy of left cervical lymph node revealed: squamous cell carcinoma.   Nutrition Status Yes No Comments  Weight changes? []  [x]    Swallowing concerns? [x]  []  He reports mild choking on fluids at times.   PEG? []  [x]     Referrals Yes No Comments  Social Work? []  [x]    Dentistry? []  [x]    Swallowing therapy? []  [x]    Nutrition? []  [x]    Med/Onc? [x]  []  Dr. Maylon Peppers 03/17/19   Safety Issues Yes No Comments  Prior radiation? []  [x]    Pacemaker/ICD? []  [x]    Possible current pregnancy? []  [x]    Is the patient on methotrexate? []  [x]     Tobacco/Marijuana/Snuff/ETOH use: He has never smoked. He drinks alcohol rarely.   Past/Anticipated interventions by otolaryngology, if any:  Dr. Benjamine Mola  Past/Anticipated interventions by medical oncology, if any:  Dr. Maylon Peppers 03/17/19    Current Complaints / other details:   PET scheduled for 03/21/19 ordered by Dr. Maylon Peppers.

## 2019-03-21 ENCOUNTER — Telehealth: Payer: Self-pay | Admitting: *Deleted

## 2019-03-21 ENCOUNTER — Encounter
Admission: RE | Admit: 2019-03-21 | Discharge: 2019-03-21 | Disposition: A | Discharge status: Home / Self Care | Payer: BC Managed Care – PPO | Source: Ambulatory Visit | Attending: Otolaryngology | Admitting: Otolaryngology

## 2019-03-21 ENCOUNTER — Encounter: Payer: Self-pay | Admitting: *Deleted

## 2019-03-21 ENCOUNTER — Other Ambulatory Visit: Payer: Self-pay

## 2019-03-21 DIAGNOSIS — C801 Malignant (primary) neoplasm, unspecified: Secondary | ICD-10-CM | POA: Diagnosis not present

## 2019-03-21 DIAGNOSIS — C4442 Squamous cell carcinoma of skin of scalp and neck: Secondary | ICD-10-CM | POA: Diagnosis not present

## 2019-03-21 DIAGNOSIS — C7989 Secondary malignant neoplasm of other specified sites: Secondary | ICD-10-CM

## 2019-03-21 LAB — GLUCOSE, CAPILLARY: Glucose-Capillary: 102 mg/dL — ABNORMAL HIGH (ref 70–99)

## 2019-03-21 MED ORDER — FLUDEOXYGLUCOSE F - 18 (FDG) INJECTION
10.6000 | Freq: Once | INTRAVENOUS | Status: AC | PRN
  Administered 2019-03-21: 10.3 via INTRAVENOUS

## 2019-03-21 NOTE — Telephone Encounter (Signed)
Oncology Nurse Navigator Documentation  Placed introductory call to new referral patient Mr. Benjamin Mccullough.  He met with Dr. Maylon Peppers and [place] 10/16, [facility].  Introduced myself as the H&N oncology nurse navigator that works with Drs. Tillie Fantasia at [facility].  Briefly explained my role as his navigator, provided my contact information.   Confirmed understanding of tomorrow's 9:30/10:00 MyChart video meetings with RN Anderson Malta and Dr. Isidore Moos, voiced understanding I will join him as well.  He indicated his wife will be joining him during appt.  He reports dental condition excellent, has never had cavities, sees dentist q6 months for cleaning.  I explained the purpose of a dental evaluation prior to starting RT, indicated he wd be contacted by [facility] to arrange an appt.    I answered his questions re tmt options. He verbalized understanding of information provided, expressed appreciation for my call.   Navigator Initial Assessment . 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 Currently on FMLA / STD:  No.   . Living Situation:  Lives with wife, 23 yr old son. Marland Kitchen PCP:  No. . PCD:  Yes, Dr. Costella Hatcher . Financial Concerns: no . Transportation Needs: no . Sensory Deficits: no . Language Barriers/Interpreter Needed:  no . Ambulation Needs: no . DME Used in Home: no . Psychosocial Needs:  no . Concerns/Needs Understanding Cancer:  Addressed/answered by navigator to best of ability . Self-Expressed Needs: no

## 2019-03-21 NOTE — Progress Notes (Signed)
Radiation Oncology         [phone] ________________________________  Initial outpatient Consultation by MyChart video during pandemic precautions  Name: Benjamin Mccullough MRN: [id number]  Date: 03/22/2019  DOB: 1960/07/02  [id number], Ishmael Holter, MD  Leta Baptist, MD   REFERRING PHYSICIAN: Leta Baptist, MD  DIAGNOSIS:  Cancer Staging Cancer of tonsillar fossa [facility]) Staging form: Pharynx - HPV-Mediated Oropharynx, AJCC 8th Edition - Clinical stage from 03/22/2019: Stage I (cT2, cN1, cM0, p16: Unknown, HPV: Unknown) - Signed by Eppie Gibson, MD on 03/22/2019    ICD-10-CM   1. 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 Screening for hypothyroidism  Z13.29 TSH   CHIEF COMPLAINT: Here to discuss management of tonsillar cancer  HISTORY OF PRESENT ILLNESS::Benjamin Mccullough is a 58 y.o. male who presented with a nodule in the left tonsillar fossa. He underwent routine dental exam when the nodule was noted incidentally.  Subsequently, the patient saw Dr. Benjamine Mola in 10/2018 who recommended close observation. Unfortunately, over the next 3 months, the patient felt that the tonsillar nodule had grown and he also developed an enlarging left neck mass.   Biopsy of the left tonsil performed on 02/21/2019 showed a benign fibroepithelial polyp.  He underwent neck CT on 03/10/2019, which revealed: prominent left level 2 lymphadenopathy with lymph nodes measuring up to 3.7 cm; asymmetric soft tissue prominence in region of the left palatine tonsil.  Patient proceeded to biopsy of the left neck mass on 03/14/2019, which confirmed malignant cells consistent with squamous cell carcinoma. There was insufficient tissue for p16  staining.  He was referred to Dr. Maylon Peppers on 03/17/2019, who recommended PET scan to determine primary malignancy.  Pertinent imaging thus far includes PET scan performed on 03/21/2019 revealing: hypermetabolic lesion in the left posterior lateral oropharynx localizing to the palatine tonsil region consistent with primary head/neck cancer, activity appears localized to the pharyngeal mucosa; two enlarged and hypermetabolic left level 2 lymph nodes consistent with local nodal metastasis; no evidence of metastatic disease outside head and neck.    Swallowing issues, if any: occasionally difficult, generally okay   Weight Changes: none  Tobacco history, if any: none  ETOH abuse, if any: occasional   He is married.  His wife is present for the consult.  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   PREVIOUS RADIATION THERAPY: No  PAST MEDICAL HISTORY:  has a past medical history of Borderline hypertension.    PAST SURGICAL HISTORY: Past Surgical History:  Procedure Laterality Date   ADENOIDECTOMY     KNEE ARTHROSCOPY W/ ACL RECONSTRUCTION      FAMILY HISTORY: family history includes CAD in his father and mother.  SOCIAL HISTORY:  reports that he has never smoked. He has never used smokeless tobacco. He reports current alcohol use. He reports that he does not use drugs.  ALLERGIES: Penicillins  MEDICATIONS:  Current Outpatient Medications  Medication Sig Dispense Refill   ibuprofen (ADVIL) 200 MG tablet Take 200 mg by mouth every 6 (six) hours as needed.     No current facility-administered medications for this encounter.  REVIEW OF SYSTEMS:  Notable for that above.   PHYSICAL EXAM:  vitals were not taken for this visit.   General: Alert and oriented, in no acute distress Psychiatric: Judgment and insight are intact. Affect is appropriate.   ECOG = 0  0 - Asymptomatic (Fully active, able to carry on all predisease activities without  restriction)  1 - Symptomatic but completely ambulatory (Restricted in physically strenuous activity but ambulatory and able to carry out work of a light or sedentary nature. For example, light housework, office work)  2 - Symptomatic, <50% in bed during the day (Ambulatory and capable of all self care but unable to carry out any work activities. Up and about more than 50% of waking hours)  3 - Symptomatic, >50% in bed, but not bedbound (Capable of only limited self-care, confined to bed or chair 50% or more of waking hours)  4 - Bedbound (Completely disabled. Cannot carry on any self-care. Totally confined to bed or chair)  5 - Death   Eustace Pen MM, Creech RH, Tormey DC, et al. [phone]). "Toxicity and response criteria of the [facility]". [place] Oncol. 5 (6): 649-55   LABORATORY DATA:  Lab Results  Component Value Date   WBC 4.9 03/17/2019   HGB 14.6 03/17/2019   HCT 42.1 03/17/2019   MCV 89.4 03/17/2019   PLT 214 03/17/2019   CMP     Component Value Date/Time   NA 139 03/17/2019 0856   K 5.4 (H) 03/17/2019 0856   CL 105 03/17/2019 0856   CO2 28 03/17/2019 0856   GLUCOSE 122 (H) 03/17/2019 0856   BUN 17 03/17/2019 0856   CREATININE 1.16 03/17/2019 0856   CALCIUM 9.9 03/17/2019 0856   PROT 7.6 03/17/2019 0856   ALBUMIN 4.9 03/17/2019 0856   AST 22 03/17/2019 0856   ALT 21 03/17/2019 0856   ALKPHOS 67 03/17/2019 0856   BILITOT 0.6 03/17/2019 0856   GFRNONAA >60 03/17/2019 0856   GFRAA >60 03/17/2019 0856      No results found for: TSH   RADIOGRAPHY: Ct Soft Tissue Neck W Contrast  Result Date: 03/10/2019 CLINICAL DATA:  Neck mass. Additional history provided: Left submandibular swelling that has improved since patient for saw Dr.Teoh. Onset about 5 months ago. Biopsy a peritonsillar mass that was benign. EXAM: CT NECK WITH CONTRAST TECHNIQUE: Multidetector CT imaging of the neck was performed using the standard protocol following the bolus  administration of intravenous contrast. CONTRAST:  41mL ISOVUE-300 IOPAMIDOL (ISOVUE-300) INJECTION 61% COMPARISON:  No pertinent prior studies available for comparison. FINDINGS: Pharynx and larynx: There is asymmetric soft tissue prominence in the region of the left palatine tonsil (series 3, image 41). No significant mass or swelling within the nasopharynx, hypopharynx or larynx. Salivary glands: No evidence of inflammation, mass or stone. Thyroid: Negative Lymph nodes: Enlarged left level II lymph node with internal regions of low attenuation, measuring 3.7 x 3.3 cm (series 6, image 71). Immediately posterior to this there is an additional enlarged left level II lymph node which measures 1.4 cm in short axis and demonstrates central low attenuation. No other pathologically enlarged cervical chain lymph nodes are identified. Vascular: The major vascular structures of the neck appear patent. Limited intracranial: Unremarkable Visualized orbits: Excluded from the field of view Mastoids and visualized paranasal sinuses: Small mucous retention cyst within the inferior left maxillary sinus. No significant mastoid effusion Skeleton: Cervical spondylosis. This includes moderate/advanced C5-C6 and C6-C7 disc height loss with small posterior  disc osteophytes at this level. Multilevel uncovertebral and facet hypertrophy also greatest at these levels. No acute bony abnormality or suspicious osseous lesion. Upper chest: No consolidation within the imaged lung apices. Incidentally noted azygos fissure. Ectatic visualized ascending aorta measuring 3.9 cm in diameter. These results will be called to the ordering clinician or representative by the Radiologist Assistant, and communication documented in the PACS or zVision Dashboard. IMPRESSION: 1. Prominent left level II lymphadenopathy with lymph nodes measuring up to 3.7 x 3.3 cm. These nodes demonstrate internal regions of low attenuation and are highly suspicious for nodal  metastatic disease. Consider direct tissue sampling for further evaluation. 2. Asymmetric soft tissue prominence in the region of the left palatine tonsil suspicious for possible primary mass given the presence of left level II lymphadenopathy. Correlate with direct visualization. 3. Ectatic visualized ascending aorta measuring 3.9 cm in diameter. Electronically Signed   By: Kellie Simmering   On: 03/10/2019 20:08   Nm Pet Image Initial (pi) Skull Base To Thigh  Result Date: 03/21/2019 CLINICAL DATA:  Initial treatment strategy for squamous cell carcinoma of the neck. EXAM: NUCLEAR MEDICINE PET SKULL BASE TO THIGH TECHNIQUE: 10.3 mCi F-18 FDG was injected intravenously. Full-ring PET imaging was performed from the skull base to thigh after the radiotracer. CT data was obtained and used for attenuation correction and anatomic localization. Fasting blood glucose: 102 mg/dl COMPARISON:  Neck CT 03/10/2019 FINDINGS: Mediastinal blood pool activity: SUV max 2.7 Liver activity: SUV max NA NECK: There is hypermetabolic activity localizing to the mucosal surface of the LEFT posterior oropharynx. This activity appears to involve the palatine tonsil region and is intense with SUV max equal 13.3. Activity extends inferiorly approximately 2 cm into the hypopharynx. Intensely hypermetabolic enlarged LEFT level II lymph node measures 2.3 cm short axis (image 46/3) with SUV max equal 11.4. A slightly more superior and posterior level II lymph node measuring 1 cm short axis (image 38/3) with SUV max equal 7.2. Mild metabolic activity associated with a contralateral RIGHT level II lymph node measuring 7 mm (image 45/3). Activity is low with SUV max equal 2.7 whichis similar to background blood pool activity. Mild activity in the RIGHT posterior lateral oropharynx with SUV max equal 3.9. Incidental CT findings: none CHEST: No hypermetabolic mediastinal or hilar nodes. No suspicious pulmonary nodules on the CT scan. Incidental CT  findings: none ABDOMEN/PELVIS: No abnormal hypermetabolic activity within the liver, pancreas, adrenal glands, or spleen. No hypermetabolic lymph nodes in the abdomen or pelvis. Incidental CT findings: none SKELETON: Choose Incidental CT findings: none IMPRESSION: 1. Hypermetabolic lesion in the LEFT posterior lateral oropharynx localizing to the palatine tonsil region consistent with primary head neck cancer. Activity appears localized to the pharyngeal mucosa. 2. Two enlarged and hypermetabolic LEFT level II lymph nodes consistent local nodal metastasis. 3. Mild activity in the RIGHT posterior lateral oropharynx and mild activity in a normal size RIGHT level II lymph node. Favored benign reactive tissue. 4. No evidence of metastatic disease outside head and neck Electronically Signed   By: Suzy Bouchard M.D.   On: 03/21/2019 11:43      IMPRESSION/PLAN:  This is a delightful patient with head and neck cancer.  He was discussed at our tumor board this morning.  Recommendation was for consideration of transoral robotic surgery.  A referral will be made to [facility] for that.  It is likely he will need adjuvant radiotherapy.  It is less likely he would need adjuvant systemic therapy if  he undergoes this.  Otherwise he is a good candidate for chemoradiotherapy.  This would be an alternative if he does not undergo surgery.  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  He does not have any significant smoking history.  We discussed the potential risks, benefits, and side effects of radiotherapy. We talked in detail about acute and late effects. We discussed that some of the most bothersome acute effects may be mucositis, dysgeusia, salivary changes, skin irritation, hair loss, dehydration, weight loss and fatigue. We talked about late effects which include but are not necessarily limited to dysphagia, hypothyroidism, nerve injury, spinal  cord injury, xerostomia, trismus, and neck edema. No guarantees of treatment were given. A consent form was signed and placed in the patient's medical record. The patient is enthusiastic about proceeding with treatment. I look forward to participating in the patient's care.    We also discussed that the treatment of head and neck cancer is a multidisciplinary process to maximize treatment outcomes and quality of life. For this reasons the following referrals have been or will be made:   Medical oncology to discuss chemotherapy    Dentistry for dental evaluation, possible extractions in the radiation fields, and /or advice on reducing risk of cavities, osteoradionecrosis, or other oral issues.   Nutritionist for nutrition support during and after treatment.   Speech language pathology for swallowing and/or speech therapy.   Social work for social support.    Physical therapy due to risk of lymphedema in neck and deconditioning.   Baseline labs including TSH.  This encounter was provided by telemedicine platform MyChart The patient has given verbal consent for this type of encounter and has been advised to only accept a meeting of this type in a secure network environment. The time spent during this encounter was 30 minutes. The attendants for this meeting include Eppie Gibson  and Otto Herb Gerlich.  During the encounter, Eppie Gibson was located at [facility] Radiation Oncology Department.  Otto Herb Lada was located at home.   __________________________________________   Eppie Gibson, MD   This document serves as a record of services personally performed by Eppie Gibson, MD. It was created on her behalf by Wilburn Mylar, a trained medical scribe. The creation of this record is based on the scribe's personal observations and the provider's statements to them. This document has been checked and approved by the attending provider.

## 2019-03-22 ENCOUNTER — Other Ambulatory Visit: Payer: Self-pay

## 2019-03-22 ENCOUNTER — Encounter: Payer: Self-pay | Admitting: Radiation Oncology

## 2019-03-22 ENCOUNTER — Encounter: Payer: Self-pay | Admitting: *Deleted

## 2019-03-22 ENCOUNTER — Ambulatory Visit
Admission: RE | Admit: 2019-03-22 | Discharge: 2019-03-22 | Disposition: A | Discharge status: Home / Self Care | Payer: BC Managed Care – PPO | Source: Ambulatory Visit | Attending: Radiation Oncology | Admitting: Radiation Oncology

## 2019-03-22 DIAGNOSIS — Z1329 Encounter for screening for other suspected endocrine disorder: Secondary | ICD-10-CM

## 2019-03-22 DIAGNOSIS — C760 Malignant neoplasm of head, face and neck: Secondary | ICD-10-CM

## 2019-03-22 DIAGNOSIS — C770 Secondary and unspecified malignant neoplasm of lymph nodes of head, face and neck: Secondary | ICD-10-CM | POA: Diagnosis not present

## 2019-03-22 DIAGNOSIS — C090 Malignant neoplasm of tonsillar fossa: Secondary | ICD-10-CM

## 2019-03-22 HISTORY — DX: Malignant neoplasm of tonsillar fossa: C09.0

## 2019-03-22 NOTE — Progress Notes (Signed)
Dental Form with Estimates of Radiation Dose      Diagnosis: tonsil cancer, left   ICD-10-CM   1. 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 Screening for hypothyroidism  Z13.29 TSH    Prognosis: good  Anticipated # of fractions: 30-35  Daily?: yes  # of weeks of radiotherapy: 6-7  Chemotherapy?: ?  Anticipated xerostomia:  Mild permanent   Pre-simulation needs:   ? Scatter protection  Simulation: pending TORS decision  Other Notes:   Please contact Eppie Gibson, MD, with patient's disposition after evaluation and/or dental treatment.

## 2019-03-23 ENCOUNTER — Telehealth: Payer: Self-pay | Admitting: *Deleted

## 2019-03-23 ENCOUNTER — Other Ambulatory Visit: Payer: Self-pay

## 2019-03-23 ENCOUNTER — Ambulatory Visit: Payer: BC Managed Care – PPO | Attending: Radiation Oncology | Admitting: Rehabilitation

## 2019-03-23 ENCOUNTER — Telehealth: Payer: Self-pay | Admitting: Hematology

## 2019-03-23 ENCOUNTER — Encounter: Payer: Self-pay | Admitting: Rehabilitation

## 2019-03-23 ENCOUNTER — Encounter (HOSPITAL_COMMUNITY): Payer: BC Managed Care – PPO

## 2019-03-23 ENCOUNTER — Encounter (HOSPITAL_COMMUNITY): Payer: Self-pay | Admitting: Dentistry

## 2019-03-23 ENCOUNTER — Ambulatory Visit (HOSPITAL_COMMUNITY): Payer: Self-pay | Admitting: Dentistry

## 2019-03-23 VITALS — BP 182/115 | HR 84 | Temp 98.7°F

## 2019-03-23 DIAGNOSIS — R293 Abnormal posture: Secondary | ICD-10-CM | POA: Insufficient documentation

## 2019-03-23 DIAGNOSIS — C090 Malignant neoplasm of tonsillar fossa: Secondary | ICD-10-CM

## 2019-03-23 DIAGNOSIS — K030 Excessive attrition of teeth: Secondary | ICD-10-CM

## 2019-03-23 DIAGNOSIS — K0530 Chronic periodontitis, unspecified: Secondary | ICD-10-CM

## 2019-03-23 DIAGNOSIS — Z01818 Encounter for other preprocedural examination: Secondary | ICD-10-CM

## 2019-03-23 DIAGNOSIS — K036 Deposits [accretions] on teeth: Secondary | ICD-10-CM

## 2019-03-23 DIAGNOSIS — K08409 Partial loss of teeth, unspecified cause, unspecified class: Secondary | ICD-10-CM

## 2019-03-23 DIAGNOSIS — K06010 Localized gingival recession, unspecified: Secondary | ICD-10-CM

## 2019-03-23 DIAGNOSIS — K031 Abrasion of teeth: Secondary | ICD-10-CM

## 2019-03-23 DIAGNOSIS — M270 Developmental disorders of jaws: Secondary | ICD-10-CM

## 2019-03-23 DIAGNOSIS — M264 Malocclusion, unspecified: Secondary | ICD-10-CM

## 2019-03-23 MED ORDER — SODIUM FLUORIDE 1.1 % DT CREA: TOPICAL_CREAM | DENTAL | 99 refills | Status: DC

## 2019-03-23 NOTE — Progress Notes (Signed)
DENTAL CONSULTATION  Date of Consultation:  03/23/2019 Patient Name:   Benjamin Mccullough Date of Birth:   [birth year] Medical Record Number: [id number]  COVID 19 SCREENING: The patient does not symptoms concerning for COVID-19 infection (Including fever, chills, cough, or new SHORTNESS OF BREATH).    VITALS: BP (!) 182/127 (BP Location: Right Arm)   Pulse 84   Temp 98.7 F (37.1 C)   CHIEF COMPLAINT: Patient referred by Dr. Isidore Moos for dental consultation.  HPI: Benjamin Mccullough is a 58 year old male referred by Dr. Isidore Moos for a dental consultation.  Patient recently diagnosed with head neck cancer involving the left tonsillar fossa as the primary tumor.  The patient is being referred for possible TORS procedure at [facility] followed by radiation therapy and possible chemotherapy. Patient to consider chemoradiation therapy alone if he does not wish to proceed with surgery.  Patient is now seen as part of a medically necessary prechemoradiation therapy dental protocol examination.  The patient currently denies acute toothaches, swellings, or abscesses.  Patient was last seen in August 2020 for an exam and cleaning with his primary dentist, Dr. Jetty Duhamel.  Patient is usually seen on an every 83-month basis.  Patient denies having any active dental needs.  Patient denies having dental phobia.  Patient denies having partial dentures.  PROBLEM LIST: Patient Active Problem List   Diagnosis Date Noted  . Cancer of tonsillar fossa ([place]) 03/22/2019    Priority: High  . Squamous cell carcinoma of head and neck ([place]) 03/17/2019  . Chest pain 04/04/2014  . Elevated blood pressure 04/04/2014    PMH: Past Medical History:  Diagnosis Date  . Borderline hypertension     PSH: Past Surgical History:  Procedure Laterality Date  . ADENOIDECTOMY    . KNEE ARTHROSCOPY W/ ACL RECONSTRUCTION      ALLERGIES: Allergies  Allergen Reactions  . Penicillins     MEDICATIONS: Current  Outpatient Medications  Medication Sig Dispense Refill  . ibuprofen (ADVIL) 200 MG tablet Take 200 mg by mouth every 6 (six) hours as needed.     No current facility-administered medications for this visit.     LABS: Lab Results  Component Value Date   WBC 4.9 03/17/2019   HGB 14.6 03/17/2019   HCT 42.1 03/17/2019   MCV 89.4 03/17/2019   PLT 214 03/17/2019      Component Value Date/Time   NA 139 03/17/2019 0856   K 5.4 (H) 03/17/2019 0856   CL 105 03/17/2019 0856   CO2 28 03/17/2019 0856   GLUCOSE 122 (H) 03/17/2019 0856   BUN 17 03/17/2019 0856   CREATININE 1.16 03/17/2019 0856   CALCIUM 9.9 03/17/2019 0856   GFRNONAA >60 03/17/2019 0856   GFRAA >60 03/17/2019 0856   No results found for: INR, PROTIME No results found for: PTT  SOCIAL HISTORY: Social History   Socioeconomic History  . Marital status: Married    Spouse name: Not on file  . Number of children: 2  . Years of education: Not on file  . Highest education level: Not on file  Occupational History    Comment: Fire department  Social Needs  . Financial resource strain: Not on file  . Food insecurity    Worry: Not on file    Inability: Not on file  . Transportation needs    Medical: No    Non-medical: No  Tobacco Use  . Smoking status: Never Smoker  . Smokeless tobacco: Never Used  Substance and Sexual  Activity  . Alcohol use: Yes    Alcohol/week: 0.0 standard drinks    Comment: Rare  . Drug use: No  . Sexual activity: Not on file  Lifestyle  . Physical activity    Days per week: Not on file    Minutes per session: Not on file  . Stress: Not on file  Relationships  . Social Herbalist on phone: Not on file    Gets together: Not on file    Attends religious service: Not on file    Active member of club or organization: Not on file    Attends meetings of clubs or organizations: Not on file    Relationship status: Not on file  . Intimate partner violence    Fear of current or ex  partner: No    Emotionally abused: No    Physically abused: No    Forced sexual activity: No  Other Topics Concern  . Not on file  Social History Narrative  . Not on file    FAMILY HISTORY: Family History  Problem Relation Age of Onset  . CAD Father        CABG in his 65s  . CAD Mother        Stents in her 51s    REVIEW OF SYSTEMS: Reviewed with the patient as per History of present illness. Psych: Patient denies having dental phobia.  DENTAL HISTORY: CHIEF COMPLAINT: Patient referred by Dr. Isidore Moos for dental consultation.  HPI: Benjamin Mccullough is a 58 year old male referred by Dr. Isidore Moos for a dental consultation.  Patient recently diagnosed with head neck cancer involving the left tonsillar fossa as the primary tumor.  The patient is being referred for possible TORS procedure at [facility] followed by radiation therapy and possible chemotherapy. Patient to consider chemoradiation therapy alone if he does not wish to proceed with surgery.  Patient is now seen as part of a medically necessary prechemoradiation therapy dental protocol examination.  The patient currently denies acute toothaches, swellings, or abscesses.  Patient was last seen in August 2020 for an exam and cleaning with his primary dentist, Dr. Jetty Duhamel.  Patient is usually seen on an every 55-month basis.  Patient denies having any active dental needs.  Patient denies having dental phobia.  Patient denies having partial dentures.   DENTAL EXAMINATION: GENERAL: The patient is a well-developed, well-nourished male in no acute distress. HEAD AND NECK: The patient has left neck lymphadenopathy.  I do not palpate any right neck lymphadenopathy.  The patient denies acute TMJ symptoms.   INTRAORAL EXAM: Patient has normal saliva.  Patient's maximum interincisal opening is 50 mm.  There is evidence of maxillary and mandibular anterior incisal attrition. DENTITION: Patient is missing only tooth #1.  Tooth numbers  16, 17, and 32 are erupted into the oral cavity. PERIODONTAL: The patient has chronic periodontitis with plaque and calculs accumulations, selective areas of gingival recession and incipient bone loss.  There is no significant tooth mobility. DENTAL CARIES/SUBOPTIMAL RESTORATIONS: No obvious dental caries are noted.  Multiple flexure lesions are noted. ENDODONTIC: The patient denies any acute pulpitis symptoms.  I do not see any evidence of periapical pathology or radiolucency. CROWN AND BRIDGE: There are no crown restorations noted. PROSTHODONTIC: There are no partial dentures. OCCLUSION: Patient has a poor occlusal scheme but a stable occlusion.  RADIOGRAPHIC INTERPRETATION: An orthopantogram was taken and supplemented with a full series of dental radiographs. There is missing tooth #1.  Tooth #'s  16,17 and 32 are fully erupted.  There is incipient bone loss. There are no obvious periapical radiolucencies.  There are no obvious dental caries noted.  Excessive maxillary and mandibular anterior attrition is noted.   ASSESSMENTS: 1.  Head neck cancer with left tonsillar fossa primary tumor 2.  Anticipated TORS procedure followed by radiation therapy and possible chemotherapy 3.  Prechemoradiation therapy dental protocol 4.  Chronic periodontitis with incipient bone loss 5.  Gingival recession 6.  Accretions-minimal 7.  Missing tooth #1 8.  Maxillary and mandibular anterior incisal attrition  9.  Lower anterior crowding 10.  Multiple flexure lesions 11.  Bilateral small mandibular lingual tori 12.  Poor occlusal scheme but a stable occlusion   PLAN/RECOMMENDATIONS: 1. I discussed the risks, benefits, and complications of various treatment options with the patient in relationship to his medical and dental conditions, probable TORS procedure followed radiation therapy +/-chemotherapy or consideration for primary chemoradiation therapy only.  We discussed various treatment options to include  no treatment, extraction of remaining wisdom teeth numbers 16, 17, and 32, pre-prosthetic surgery as indicated, periodontal therapy, dental restorations, root canal therapy, crown and bridge therapy, implant therapy, and replacement of missing teeth as indicated.  The patient currently wishes to defer any dental treatment at this time.  A prescription for Prevident 5000 fluoride toothpaste will be sent to [place] for the patient touse at bedtime.  Refills for 1 year will be provided.  Patient is currently cleared for TORS procedure and chemoradiation therapy.   2. Discussion of findings with medical team and coordination of future medical and dental care as needed.  I spent in excess of  90 minutes during the conduct of this consultation and >50% of this time involved direct face-to-face encounter for counseling and/or coordination of the patient's care.    Lenn Cal, DDS

## 2019-03-23 NOTE — Patient Instructions (Signed)
discussed head and neck packet exercises and postural considerations, radiation side effects and importance of staying fit and active, walking during treatment

## 2019-03-23 NOTE — Telephone Encounter (Signed)
Scheduled appt per 10/21 sch message - pt aware of appt date and time

## 2019-03-23 NOTE — Therapy (Signed)
[place], [place], [address] Phone: [phone]   Fax:  [phone]  Physical Therapy Evaluation  Patient Details  Name: Benjamin Mccullough MRN: [id number] Date of Birth: April 25, 1961 Referring Provider (PT): Dr. Isidore Moos   Encounter Date: 03/23/2019  PT End of Session - 03/23/19 1453    Visit Number  1    Number of Visits  1    PT Start Time  1406    PT Stop Time  1438    PT Time Calculation (min)  32 min    Activity Tolerance  Patient tolerated treatment well    Behavior During Therapy  [facility] for tasks assessed/performed       Past Medical History:  Diagnosis Date  . Borderline hypertension     Past Surgical History:  Procedure Laterality Date  . ADENOIDECTOMY    . KNEE ARTHROSCOPY W/ ACL RECONSTRUCTION      There were no vitals filed for this visit.   Subjective Assessment - 03/23/19 1407    Subjective  hear to get information before surgery    Pertinent History  CT neck in 03/2019 showed a left Level II LN 3.7 x 3.3cm w/ an additional adjacent LN ~1.4cm without any additional suspicious LN's.  There was some asymmetry in the left palatine tonsil.  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 The patient being referred for possible T ORS procedure at [facility] followed by radiation therapy and possible chemotherapy.  Patient to consider chemoradiation therapy if he does not wish to proceed with surgery.  Other history includes: left ACL replacement    Limitations  --   none   Patient Stated Goals  get information from providers    Currently in Pain?  No/denies         [facility] PT Assessment - 03/23/19 0001      Assessment   Medical Diagnosis  SCC of the left tonsil    Referring Provider (PT)  Dr. Isidore Moos    Hand Dominance  Right    Prior Therapy  no      Precautions   Precaution Comments  none      Restrictions   Weight  Bearing Restrictions  No    Other Position/Activity Restrictions  no      Balance Screen   Has the patient fallen in the past 6 months  No    Has the patient had a decrease in activity level because of a fear of falling?   No    Is the patient reluctant to leave their home because of a fear of falling?   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    Leisure  trail running, [employer] lifting      Cognition   Overall Cognitive Status  Within Functional Limits for tasks assessed      Observation/Other Assessments   Observations  very in shape active individual      Sensation   Light Touch  Appears Intact      Coordination   Gross  Motor Movements are Fluid and Coordinated  Yes      Posture/Postural Control   Posture/Postural Control  Postural limitations    Postural Limitations  Rounded Shoulders;Forward head      ROM / Strength   AROM / PROM / Strength  AROM      AROM   Overall AROM Comments  bil shoulder AROM WNL and painfree    AROM Assessment Site  Shoulder;Cervical    Right/Left Shoulder  Right;Left    Cervical Flexion  56    Cervical Extension  70    Cervical - Right Side Bend  38    Cervical - Left Side Bend  45    Cervical - Right Rotation  70    Cervical - Left Rotation  70        LYMPHEDEMA/ONCOLOGY QUESTIONNAIRE - 03/23/19 1424      Type   Cancer Type  SCC of the left tonsil      Lymphedema Assessments   Lymphedema Assessments  Head and Neck      Head and Neck   Right Lateral Nostril at base of nose to medial tragus   11.5 cm    Left Lateral Nostril at base of nose to medial tragus   10.5 cm    Right Corner of mouth to where ear lobe meets face  10.2 cm    Left Corner of  mouth to where ear lobe meets face  10.1 cm    4 cm superior to sternal notch around neck  41.5 cm    6 cm superior to sternal notch around neck  41 cm    8 cm superior to sternal notch around neck  40.7 cm             Objective measurements completed on examination: See above findings.      [facility] Adult PT Treatment/Exercise - 03/23/19 0001      Self-Care   Self-Care  Other Self-Care Comments    Other Self-Care Comments   discussed head and neck packet exercises and postural considerations, radiation side effects and importance of staying fit and active, walking during treatment      Exercises   Exercises  Other Exercises             PT Education - 03/23/19 1452    Education Details  discussed head and neck packet exercises and postural considerations, radiation side effects and importance of staying fit and active, walking during treatment    Person(s) Educated  Patient    Methods  Explanation;Demonstration;Tactile cues;Verbal cues;Handout    Comprehension  Verbalized understanding;Returned demonstration;Verbal cues required              Head and Neck Clinic Goals - 03/23/19 1455      Patient will be able to verbalize understanding of a home exercise program for cervical range of motion, posture, and walking.    Status  Achieved      Patient will be able to verbalize understanding of proper sitting and standing posture.    Status  Achieved      Patient will be able to verbalize understanding of lymphedema risk and availability of treatment for this condition.    Status  Achieved         Plan - 03/23/19 1453    Clinical Impression Statement  Pt presents with new diagnosis of left sided tonsil SCC with 2 involved lymph nodes.  Most likely TORs procedure and radiation but pt has not met with  the surgeon yet.  Took baseline measurements and educated pt on post op stretches, importance of walking throughout radiation, and how to monitor  for lymphedema.    Stability/Clinical Decision Making  Stable/Uncomplicated    Clinical Decision Making  Low    Rehab Potential  Excellent    PT Frequency  One time visit    PT Treatment/Interventions  ADLs/Self Care Home Management    PT Next Visit Plan  reassess if returns    Consulted and Agree with Plan of Care  Patient       Patient will benefit from skilled therapeutic intervention in order to improve the following deficits and impairments:  Decreased knowledge of precautions  Visit Diagnosis: Abnormal posture     Problem List Patient Active Problem List   Diagnosis Date Noted  . Cancer of tonsillar fossa ([place]) 03/22/2019  . 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 Chest pain 04/04/2014  . Elevated blood pressure 04/04/2014    Stark Bray 03/23/2019, 2:55 PM  [place], [place], [address] Phone: [phone]   Fax:  [phone]  Name: Aidon Klemens MRN: [id number] Date of Birth: [birth year]

## 2019-03-23 NOTE — Patient Instructions (Signed)

## 2019-03-23 NOTE — Telephone Encounter (Signed)
CALLED PATIENT TO INFORM OF APPT. WITH JOSHUA WALTONEN ON 04-05-19 - ARRIVAL TIME- 9:15 AM @ [facility] IN [place], SPOKE WITH PATIENT AND HE IS AWARE OF THIS APPT.

## 2019-03-24 NOTE — Progress Notes (Signed)
A user error has taken place: encounter opened in error, closed for administrative reasons.

## 2019-03-28 ENCOUNTER — Emergency Department (HOSPITAL_BASED_OUTPATIENT_CLINIC_OR_DEPARTMENT_OTHER): Payer: BC Managed Care – PPO

## 2019-03-28 ENCOUNTER — Other Ambulatory Visit: Payer: Self-pay

## 2019-03-28 ENCOUNTER — Encounter (HOSPITAL_BASED_OUTPATIENT_CLINIC_OR_DEPARTMENT_OTHER): Payer: Self-pay

## 2019-03-28 ENCOUNTER — Emergency Department (HOSPITAL_BASED_OUTPATIENT_CLINIC_OR_DEPARTMENT_OTHER)
Admission: EM | Admit: 2019-03-28 | Discharge: 2019-03-28 | Disposition: A | Discharge status: Home / Self Care | Payer: BC Managed Care – PPO | Attending: Emergency Medicine | Admitting: Emergency Medicine

## 2019-03-28 DIAGNOSIS — R42 Dizziness and giddiness: Secondary | ICD-10-CM | POA: Insufficient documentation

## 2019-03-28 DIAGNOSIS — Z880 Allergy status to penicillin: Secondary | ICD-10-CM | POA: Diagnosis not present

## 2019-03-28 DIAGNOSIS — C090 Malignant neoplasm of tonsillar fossa: Secondary | ICD-10-CM | POA: Diagnosis not present

## 2019-03-28 DIAGNOSIS — Z79899 Other long term (current) drug therapy: Secondary | ICD-10-CM | POA: Diagnosis not present

## 2019-03-28 DIAGNOSIS — I10 Essential (primary) hypertension: Secondary | ICD-10-CM | POA: Diagnosis not present

## 2019-03-28 DIAGNOSIS — L570 Actinic keratosis: Secondary | ICD-10-CM | POA: Diagnosis not present

## 2019-03-28 DIAGNOSIS — R519 Headache, unspecified: Secondary | ICD-10-CM | POA: Diagnosis not present

## 2019-03-28 HISTORY — DX: Malignant (primary) neoplasm, unspecified: C80.1

## 2019-03-28 LAB — CBC
HCT: 41.6 % (ref 39.0–52.0)
Hemoglobin: 14.5 g/dL (ref 13.0–17.0)
MCH: 31.3 pg (ref 26.0–34.0)
MCHC: 34.9 g/dL (ref 30.0–36.0)
MCV: 89.7 fL (ref 80.0–100.0)
Platelets: 206 10*3/uL (ref 150–400)
RBC: 4.64 MIL/uL (ref 4.22–5.81)
RDW: 12.3 % (ref 11.5–15.5)
WBC: 4.9 10*3/uL (ref 4.0–10.5)
nRBC: 0 % (ref 0.0–0.2)

## 2019-03-28 LAB — BASIC METABOLIC PANEL
Anion gap: 8 (ref 5–15)
BUN: 13 mg/dL (ref 6–20)
CO2: 27 mmol/L (ref 22–32)
Calcium: 9.4 mg/dL (ref 8.9–10.3)
Chloride: 104 mmol/L (ref 98–111)
Creatinine, Ser: 0.88 mg/dL (ref 0.61–1.24)
GFR calc Af Amer: >60 mL/min (ref >60)
GFR calc non Af Amer: >60 mL/min (ref >60)
Glucose, Bld: 108 mg/dL — ABNORMAL HIGH (ref 70–99)
Potassium: 3.6 mmol/L (ref 3.5–5.1)
Sodium: 139 mmol/L (ref 135–145)

## 2019-03-28 MED ORDER — DIPHENHYDRAMINE HCL 50 MG/ML IJ SOLN
25.0000 mg | Freq: Once | INTRAMUSCULAR | Status: AC
  Administered 2019-03-28: 18:00:00 25 mg via INTRAVENOUS
  Filled 2019-03-28: qty 1

## 2019-03-28 MED ORDER — PROCHLORPERAZINE EDISYLATE 10 MG/2ML IJ SOLN
10.0000 mg | Freq: Once | INTRAMUSCULAR | Status: AC
  Administered 2019-03-28: 10 mg via INTRAVENOUS
  Filled 2019-03-28: qty 2

## 2019-03-28 MED ORDER — SODIUM CHLORIDE 0.9 % IV BOLUS
1000.0000 mL | Freq: Once | INTRAVENOUS | Status: AC
  Administered 2019-03-28: 1000 mL via INTRAVENOUS

## 2019-03-28 MED ORDER — HYDROCHLOROTHIAZIDE 25 MG PO TABS: 25.0000 mg | ORAL_TABLET | Freq: Every day | ORAL | 0 refills | Status: DC

## 2019-03-28 MED ORDER — LABETALOL HCL 5 MG/ML IV SOLN
10.0000 mg | Freq: Once | INTRAVENOUS | Status: AC
  Administered 2019-03-28: 18:00:00 10 mg via INTRAVENOUS
  Filled 2019-03-28: qty 4

## 2019-03-28 MED ORDER — KETOROLAC TROMETHAMINE 15 MG/ML IJ SOLN
15.0000 mg | Freq: Once | INTRAMUSCULAR | Status: AC
  Administered 2019-03-28: 15 mg via INTRAVENOUS
  Filled 2019-03-28: qty 1

## 2019-03-28 NOTE — Discharge Instructions (Addendum)
Start taking blood pressure medication daily in the morning, continue to monitor your blood pressures and keep a log to share with your primary care doctor I would like for you to follow-up for continued blood pressure management in about 1 week.  Your head CT was reassuring and I am glad that your headache is improved.  Return for new or worsening headache associated vision changes, dizziness, numbness, weakness or any other new or concerning symptoms.

## 2019-03-28 NOTE — Progress Notes (Signed)
Oncology Nurse Navigator Documentation  Met with Benjamin Mccullough during initial WebEx consult with Dr. Isidore Moos.  He was joined by his wife.   . Further introduced myself as his Navigator, explained my role as a member of the Care Team.   . Provided introductory explanation of radiation treatment including SIM planning and purpose of Aquaplast head and shoulder mask, showed them example.  Marland Kitchen He voiced understanding of: Marland Kitchen TB recommendation for TORS, voiced understanding Dr. Isidore Moos to place referral to [facility] for surgical consult.  Marland Kitchen Pending referrals to PT, SLP, Nutrition and SW; value of PT and SLP prior to TORS.  They verbalized understanding of information provided.   They agreed to call me with questions/concerns as appts/treatments/procedures begin.   [name], RN, BSN Head & Neck Oncology Nurse [place] at [place] [phone]

## 2019-03-28 NOTE — ED Triage Notes (Signed)
Pt c/o intermittent HA -recent dx of "neck cancer"-states he has had multiple MD visits with some elevated BP-no meds started-dizziness and nausea today ~2pm-took motrin x 2 PTA no relief-NAD-steady gait

## 2019-03-28 NOTE — ED Provider Notes (Signed)
[facility] EMERGENCY DEPARTMENT Provider Note   CSN: [id number] Arrival date & time: 03/28/19  1643     History   Chief Complaint Chief Complaint  Patient presents with  . Headache    HPI Benjamin Mccullough is a 58 y.o. male.     Benjamin Mccullough is a 58 y.o. 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  Patient reports that in the past he has had elevated blood pressures intermittently but has never been on consistent medication for this over the past few days patient has noticed higher than usual blood pressure at recent doctor's appointments has been a bit elevated but he has not been started on any medications.  Today when he woke up his blood pressure was the highest he has ever seen systolic in the [id number] and diastolic over [id number], initially he was not symptomatic with this but between 1 PM and 1:30 he developed a sudden onset headache which he describes as severe.  He reports it started as a constant throbbing ache it has now settled in the occipital region of the head and is a dull constant ache.  He denies associated visual changes.  No associated dizziness or lightheadedness.  He did have some nausea when headache became severe but no episodes of vomiting.  No numbness tingling or weakness.  No difficulty walking.  He denies any associated chest pain or shortness of breath.  No lower extremity swelling.  No other aggravating or alleviating factors.  Patient was recently diagnosed with left-sided tonsillar cancer and is planning to have surgery soon with associated radiation that they hope will be curative.  He is not on any chemotherapy or new medications.     Past Medical History:  Diagnosis Date  . Borderline hypertension   . Cancer [facility])     Patient Active Problem List   Diagnosis Date Noted  . Cancer of  tonsillar fossa ([place]) 03/22/2019  . 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 Chest pain 04/04/2014  . Elevated blood pressure 04/04/2014    Past Surgical History:  Procedure Laterality Date  . ADENOIDECTOMY    . KNEE ARTHROSCOPY W/ ACL RECONSTRUCTION          Home Medications    Prior to Admission medications   Medication Sig Start Date End Date Taking? Authorizing Provider  ibuprofen (ADVIL) 200 MG tablet Take 200 mg by mouth every 6 (six) hours as needed.    [provider]  sodium fluoride (PREVIDENT 5000 PLUS) 1.1 % CREA dental cream Apply to tooth brush. Brush teeth for 2 minutes. Spit out excess-DO NOT swallow. DO NOT rinse afterwards. Repeat nightly. 03/23/19   Lenn Cal, DDS    Family History Family History  Problem Relation Age of Onset  . CAD Father        CABG in his 42s  . CAD Mother        Stents in her 69s    Social History Social History   Tobacco Use  . Smoking status: Never Smoker  . Smokeless tobacco: Never Used  Substance Use Topics  . Alcohol use: Not Currently    Alcohol/week: 0.0 standard drinks  . Drug use: No     Allergies   Penicillins   Review of Systems Review of Systems  Constitutional: Negative for chills and fever.  HENT: Negative.   Eyes: Negative for photophobia and visual disturbance.  Respiratory: Negative for cough, chest tightness and shortness of breath.   Cardiovascular: Negative for chest pain, palpitations and leg swelling.  Gastrointestinal: Positive for nausea. Negative for abdominal pain, constipation, diarrhea and vomiting.  Genitourinary: Negative for dysuria.  Musculoskeletal: Negative for neck pain and neck stiffness.  Skin: Negative for color change and rash.  Neurological: Positive for headaches. Negative for dizziness, syncope, facial asymmetry, speech difficulty, weakness, light-headedness and numbness.     Physical Exam Updated Vital Signs BP (!) 185/128 (BP  Location: Left Arm)   Pulse 78   Temp 98.6 F (37 C) (Oral)   Resp 18   Ht 6\' 2"  (1.88 m)   Wt 94.3 kg   SpO2 98%   BMI 26.71 kg/m   Physical Exam Vitals signs and nursing note reviewed.  Constitutional:      General: He is not in acute distress.    Appearance: He is well-developed and normal weight. He is not ill-appearing or diaphoretic.  HENT:     Head: Normocephalic and atraumatic.     Mouth/Throat:     Mouth: Mucous membranes are moist.     Pharynx: Oropharynx is clear.  Eyes:     General:        Right eye: No discharge.        Left eye: No discharge.     Extraocular Movements: Extraocular movements intact.     Left eye: No nystagmus.     Pupils: Pupils are equal, round, and reactive to light.  Neck:     Musculoskeletal: Neck supple. No neck rigidity.     Meningeal: Kernig's sign absent.  Cardiovascular:     Rate and Rhythm: Normal rate and regular rhythm.     Heart sounds: Normal heart sounds.  Pulmonary:     Effort: Pulmonary effort is normal. No respiratory distress.     Breath sounds: Normal breath sounds. No wheezing or rales.     Comments: Respirations equal and unlabored, patient able to speak in full sentences, lungs clear to auscultation bilaterally Abdominal:     General: Bowel sounds are normal. There is no distension.     Palpations: Abdomen is soft. There is no mass.     Tenderness: There is no abdominal tenderness. There is no guarding.     Comments: Abdomen soft, nondistended, nontender to palpation in all quadrants without guarding or peritoneal signs  Musculoskeletal:        General: No deformity.  Skin:    General: Skin is warm and dry.     Capillary Refill: Capillary refill takes less than 2 seconds.  Neurological:     Mental Status: He is alert.     GCS: GCS eye subscore is 4. GCS verbal subscore is 5. GCS motor subscore is 6.     Coordination: Coordination normal.     Comments: Speech is clear, able to follow commands CN III-XII intact  Normal strength in upper and lower extremities bilaterally including dorsiflexion and plantar flexion, strong and equal grip strength Sensation normal to light and sharp touch Moves extremities without ataxia, coordination intact Normal finger to nose and rapid alternating movements No pronator drift  Psychiatric:        Mood and Affect: Mood normal.        Behavior: Behavior normal.      ED Treatments / Results  Labs (all labs ordered are listed, but only abnormal results are displayed) Labs Reviewed  BASIC METABOLIC PANEL - Abnormal; Notable for the following components:      Result Value   Glucose, Bld 108 (*)    All other components within normal limits  CBC    EKG EKG Interpretation  Date/Time:  Tuesday March 28 2019 17:10:22 EDT Ventricular Rate:  78 PR Interval:  156 QRS Duration: 114 QT Interval:  392 QTC Calculation: 446 R Axis:   83 Text Interpretation: Normal sinus rhythm Incomplete right bundle branch block Borderline ECG No significant change since last tracing Confirmed by Gareth Morgan [phone]) on 03/28/2019 5:35:17 PM   Radiology Ct Head Wo Contrast  Result Date: 03/28/2019 CLINICAL DATA:  Intermittent headache and dizziness. Recent diagnosis of head and neck squamous cell cancer. EXAM: CT HEAD WITHOUT CONTRAST TECHNIQUE: Contiguous axial images were obtained from the base of the skull through the vertex without intravenous contrast. COMPARISON:  None. FINDINGS: Brain: No evidence of acute infarction, hemorrhage, hydrocephalus, extra-axial collection or mass lesion/mass effect. Vascular: No hyperdense vessel or unexpected calcification. Skull: Normal. Negative for fracture or focal lesion. Sinuses/Orbits: No acute finding. Other: None. IMPRESSION: Normal noncontrast head CT. Electronically Signed   By: Titus Dubin M.D.   On: 03/28/2019 18:55    Procedures Procedures (including critical care time)  Medications Ordered in ED Medications  sodium  chloride 0.9 % bolus 1,000 mL (0 mLs Intravenous Stopped 03/28/19 1925)  prochlorperazine (COMPAZINE) injection 10 mg (10 mg Intravenous Given 03/28/19 1818)  diphenhydrAMINE (BENADRYL) injection 25 mg (25 mg Intravenous Given 03/28/19 1817)  labetalol (NORMODYNE) injection 10 mg (10 mg Intravenous Given 03/28/19 1810)  ketorolac (TORADOL) 15 MG/ML injection 15 mg (15 mg Intravenous Given 03/28/19 1932)     Initial Impression / Assessment and Plan / ED Course  I have reviewed the triage vital signs and the nursing notes.  Pertinent labs & imaging results that were available during my care of the patient were reviewed by me and considered in my medical decision making (see chart for details).  58 year old male presents with concern for hypertension and sudden onset headache.  On arrival patient's blood pressure 194/119, has never been on consistent blood pressure medication.  Noticed blood pressure was elevated this morning and around 1 PM this afternoon he had a sudden onset, without associated vision changes, dizziness, numbness or weakness.  Patient reports headache is improved somewhat but is still present across the back of his head.  Patient is within 6-hour window for Noncon head CT to rule out subarachnoid hemorrhage or other acute abnormality, will proceed with head CT and basic labs.  Patient is not having any other symptoms associated with hypertensive urgency or emergency, no chest pain, shortness of breath or lower extremity swelling.  Patient has no focal neurologic deficits to suggest stroke.  Will get basic labs, EKG and head CT.  Will give 10 mg of labetalol for elevated blood pressure as well as headache cocktail.  Lab work is reassuring, normal renal function, no acute electrolyte derangements, no leukocytosis.  Patient's EKG has no concerning changes.  CT of the head is normal.  On reevaluation patient's blood pressure has improved from 194/119-160 8/108, patient reports headache  is improving as well.  At this time feel patient is stable for discharge home, will start him on 25 mg HCTZ daily I have asked him to keep a daily blood pressure log and he will need to follow-up in 1 week for further blood pressure management.  He will also be following up with his oncologist  for continued management of newfound tonsillar carcinoma.  Return precautions discussed.  Patient and wife expressed understanding and agreement with plan.  Discharged home in good condition.  Care discussed with Dr. Billy Fischer who is in agreement with plan.  Final Clinical Impressions(s) / ED Diagnoses   Final diagnoses:  Bad headache  Hypertension, unspecified type    ED Discharge Orders    None       Jacqlyn Larsen, [place] 03/31/19 1129    Gareth Morgan, MD 04/01/19 1226

## 2019-03-29 ENCOUNTER — Other Ambulatory Visit: Payer: Self-pay

## 2019-03-29 ENCOUNTER — Inpatient Hospital Stay: Payer: BC Managed Care – PPO | Admitting: Nutrition

## 2019-03-29 ENCOUNTER — Telehealth: Payer: Self-pay | Admitting: *Deleted

## 2019-03-29 NOTE — Telephone Encounter (Signed)
TCT patient regarding upcoming appts.  Spoke with patient to make him aware of some changes.  He will see Dr. Maylon Peppers on 11/10 instead of tomorrow as he sees Dr. Nicolette Bang @ [facility] on 11/4. So he will see Dr. Maylon Peppers aftwer that appt. Pt voiced understanding

## 2019-03-29 NOTE — Progress Notes (Signed)
58 year old male diagnosed with tonsil cancer.  He is a patient of Dr. Maylon Peppers and Dr. Isidore Moos. The plan is for him to have TORS and then radiation therapy.  Past medical history includes borderline hypertension.  Medications were reviewed.  Labs include glucose 108.  Height: 6 feet 2 inches. Weight: 208 pounds on October 27. BMI: 26.71.  Patient presents in person to nutrition consult.  He currently is eating well and exercises regularly.  He denies any weight loss.  He is anxious to have surgery and get started with radiation treatments.  He has questions about sugar and cancer.  Nutrition diagnosis:  Predicted suboptimal energy intake related to new diagnosis of tonsil cancer as evidenced by history or presence of a condition for which research shows an increased incidence of suboptimal energy intake.  Intervention: Patient educated to consume healthy plant-based diet with adequate calories and protein for weight maintenance. Recommended patient explore protein supplements and provided samples of orgain and [employer]. Educated briefly about sugar and cancer and provided fact sheets. Reviewed soft diet and provided additional fact sheets on soft protein foods. Contact information provided.  Monitoring, evaluation, goals: Patient will tolerate adequate calories and protein to minimize weight loss and promote healing.  Next visit: To be scheduled weekly with radiation therapy.  **Disclaimer: This note was dictated with voice recognition software. Similar sounding words can inadvertently be transcribed and this note may contain transcription errors which may not have been corrected upon publication of note.**

## 2019-03-30 ENCOUNTER — Inpatient Hospital Stay: Payer: BC Managed Care – PPO | Admitting: Hematology

## 2019-03-30 ENCOUNTER — Inpatient Hospital Stay: Payer: BC Managed Care – PPO

## 2019-03-30 ENCOUNTER — Encounter: Payer: Self-pay | Admitting: *Deleted

## 2019-03-31 DIAGNOSIS — C760 Malignant neoplasm of head, face and neck: Secondary | ICD-10-CM | POA: Diagnosis not present

## 2019-04-03 ENCOUNTER — Other Ambulatory Visit: Payer: Self-pay

## 2019-04-04 ENCOUNTER — Encounter: Payer: Self-pay | Admitting: Medical

## 2019-04-04 ENCOUNTER — Ambulatory Visit: Payer: BC Managed Care – PPO | Admitting: Medical

## 2019-04-04 ENCOUNTER — Other Ambulatory Visit: Payer: Self-pay

## 2019-04-04 VITALS — BP 146/89 | HR 80 | Temp 97.6°F | Resp 16 | Ht 74.0 in | Wt 202.0 lb

## 2019-04-04 DIAGNOSIS — C760 Malignant neoplasm of head, face and neck: Secondary | ICD-10-CM

## 2019-04-04 DIAGNOSIS — C090 Malignant neoplasm of tonsillar fossa: Secondary | ICD-10-CM | POA: Diagnosis not present

## 2019-04-04 DIAGNOSIS — R739 Hyperglycemia, unspecified: Secondary | ICD-10-CM

## 2019-04-04 DIAGNOSIS — I10 Essential (primary) hypertension: Secondary | ICD-10-CM

## 2019-04-04 DIAGNOSIS — Z125 Encounter for screening for malignant neoplasm of prostate: Secondary | ICD-10-CM

## 2019-04-04 DIAGNOSIS — Z113 Encounter for screening for infections with a predominantly sexual mode of transmission: Secondary | ICD-10-CM | POA: Diagnosis not present

## 2019-04-04 LAB — COMPREHENSIVE METABOLIC PANEL
ALT: 16 U/L (ref 0–53)
AST: 19 U/L (ref 0–37)
Albumin: 4.8 g/dL (ref 3.5–5.2)
Alkaline Phosphatase: 63 U/L (ref 39–117)
BUN: 29 mg/dL — ABNORMAL HIGH (ref 6–23)
CO2: 32 mEq/L (ref 19–32)
Calcium: 9.6 mg/dL (ref 8.4–10.5)
Chloride: 101 mEq/L (ref 96–112)
Creatinine, Ser: 1.18 mg/dL (ref 0.40–1.50)
GFR: 63.26 mL/min (ref >60.00)
Glucose, Bld: 112 mg/dL — ABNORMAL HIGH (ref 70–99)
Potassium: 4.3 mEq/L (ref 3.5–5.1)
Sodium: 139 mEq/L (ref 135–145)
Total Bilirubin: 0.6 mg/dL (ref 0.2–1.2)
Total Protein: 7.4 g/dL (ref 6.0–8.3)

## 2019-04-04 LAB — LIPID PANEL
Cholesterol: 187 mg/dL (ref 0–200)
HDL: 46.6 mg/dL (ref >39.00)
LDL Cholesterol: 125 mg/dL — ABNORMAL HIGH (ref 0–99)
NonHDL: 140.83
Total CHOL/HDL Ratio: 4
Triglycerides: 79 mg/dL (ref 0.0–149.0)
VLDL: 15.8 mg/dL (ref 0.0–40.0)

## 2019-04-04 LAB — PSA: PSA: 4 ng/mL (ref 0.10–4.00)

## 2019-04-04 MED ORDER — LOSARTAN POTASSIUM 25 MG PO TABS: 25.0000 mg | ORAL_TABLET | Freq: Every day | ORAL | 3 refills | Status: DC

## 2019-04-04 NOTE — Progress Notes (Signed)
Subjective:    Patient ID: Benjamin Mccullough, male    DOB: [birth year], 58 y.o.   MRN: [id number]  HPI  Pt in for first time.   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 He had recent throat cancer dx. Pt states his dentist saw abnormality of his tonsil. Pt had biospy done that was originally negative. Later her had some swelling of his lymph node. Pt did get biopsy of node that showed was cancer. Pt not smoker or drinker. 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  Pt states recent stress and his bp seems higher. He thinks in past his bp was probably borderline. Pt states he may get surgery mid month. Then he may have radiation. Pt wants to have bp controlled before surgery.  Last week he had very high bp's. Since he started hctz. His bp have been 158/109, 154/93, 163/94, 172/98, 179/103, 145/89, 156/90, 149/98, 152/94, 132/77, 136/83, 154/79, 131/78, 137/76, 139/85, 148/86, 131/76, 137/85, 140/89, 131/77, 131/79, 148/81, 150/88. These are most past to recent.   Review of Systems  Constitutional: Negative for chills, fatigue and fever.  HENT: Negative for congestion, ear pain, postnasal drip, sinus pressure, sinus pain and sore throat.   Respiratory: Negative for cough, chest tightness, shortness of breath and wheezing.   Cardiovascular: Negative for chest pain and palpitations.  Gastrointestinal: Negative for abdominal pain.  Genitourinary: Negative for dysuria, frequency, hematuria, penile pain, penile swelling and testicular pain.  Musculoskeletal: Negative for back pain.  Neurological: Negative for dizziness and light-headedness.  Hematological: Negative for adenopathy. Does not bruise/bleed easily.  Psychiatric/Behavioral: Negative for behavioral problems, decreased concentration, sleep disturbance and suicidal ideas. The patient is not nervous/anxious.        Some stress.    Past Medical History:  Diagnosis Date   Borderline hypertension     Cancer ([place])      Social History   Socioeconomic History   Marital status: Married    Spouse name: Not on file   Number of children: 2   Years of education: Not on file   Highest education level: Not on file  Occupational History    Comment: Research officer, trade union  Social Needs   Financial resource strain: Not on file   Food insecurity    Worry: Not on file    Inability: Not on file   Transportation needs    Medical: No    Non-medical: No  Tobacco Use   Smoking status: Never Smoker   Smokeless tobacco: Never Used  Substance and Sexual Activity   Alcohol use: Not Currently    Alcohol/week: 0.0 standard drinks   Drug use: No   Sexual activity: Not on file  Lifestyle   Physical activity    Days per week: Not on file    Minutes per session: Not on file   Stress: Not on file  Relationships   Social connections    Talks on phone: Not on file    Gets together: Not on file    Attends religious service: Not on file    Active member of club or organization: Not on file    Attends meetings of clubs or organizations: Not on file    Relationship status: Not on file   Intimate partner violence    Fear of current or ex partner: No    Emotionally abused: No    Physically abused: No    Forced sexual activity: No  Other Topics Concern  Not on file  Social History Narrative   Not on file    Past Surgical History:  Procedure Laterality Date   ADENOIDECTOMY     KNEE ARTHROSCOPY W/ ACL RECONSTRUCTION      Family History  Problem Relation Age of Onset   CAD Father        CABG in his 56s   CAD Mother        Stents in her 42s    Allergies  Allergen Reactions   Erythromycin Base Other (See Comments)    Childhood reaction   Penicillins     Current Outpatient Medications on File Prior to Visit  Medication Sig Dispense Refill   hydrochlorothiazide (HYDRODIURIL) 25 MG tablet Take 1 tablet (25 mg total) by mouth daily. 30 tablet 0   ibuprofen  (ADVIL) 200 MG tablet Take 200 mg by mouth every 6 (six) hours as needed.     sodium fluoride (PREVIDENT 5000 PLUS) 1.1 % CREA dental cream Apply to tooth brush. Brush teeth for 2 minutes. Spit out excess-DO NOT swallow. DO NOT rinse afterwards. Repeat nightly. 1 Tube prn   sodium polystyrene (KAYEXALATE) powder MIX WITH WATER AND TAKE BY MOUTH 4 LEVEL TEASPOONS (15G) DAILY FOR 3 DAYS     No current facility-administered medications on file prior to visit.     BP (!) 146/89    Pulse 80    Temp 97.6 F (36.4 C) (Temporal)    Resp 16    Ht 6\' 2"  (1.88 m)    Wt 202 lb (91.6 kg)    SpO2 100%    BMI 25.94 kg/m       Objective:   Physical Exam  General Mental Status- Alert. General Appearance- Not in acute distress.   Skin General: Color- Normal Color. Moisture- Normal Moisture.  Neck Carotid Arteries- Normal color. Moisture- Normal Moisture. No carotid bruits. No JVD.  Chest and Lung Exam Auscultation: Breath Sounds:-Normal.  Cardiovascular Auscultation:Rythm- Regular. Murmurs & Other Heart Sounds:Auscultation of the heart reveals- No Murmurs.  Abdomen Inspection:-Inspeection Normal. Palpation/Percussion:Note:No mass. Palpation and Percussion of the abdomen reveal- Non Tender, Non Distended + BS, no rebound or guarding.   Neurologic Cranial Nerve exam:- CN III-XII intact(No nystagmus), symmetric smile. Strength:- 5/5 equal and symmetric strength both upper and lower extremities.      Assessment & Plan:  For throat cancer continue with treatment with specialist.  For htn, bp high despite use of hctz 25 mg so will add losartan 25 mg daily to regimen. Get cmp today to check k level.  Will get lipid panel today.  For high sugar will get a1c today. Eat low sugar diet.  Will also get lipid panel today.  Will get screening psa today.  Please get old colonoscopy report and date.  Follow up 1 month or as needed.

## 2019-04-04 NOTE — Patient Instructions (Addendum)
For throat cancer continue with treatment with specialist.  For htn, bp high despite use of hctz 25 mg so will add losartan 25 mg daily to regimen. Get cmp today to check k level.  Will get lipid panel today.  For high sugar will get a1c today. Eat low sugar diet.  Will also get lipid panel today.  Will get screening psa today.  Please get old colonoscopy report and date.  Follow up 1 month or as needed.

## 2019-04-05 ENCOUNTER — Telehealth: Payer: Self-pay | Admitting: *Deleted

## 2019-04-05 ENCOUNTER — Other Ambulatory Visit: Payer: Self-pay | Admitting: Hematology

## 2019-04-05 ENCOUNTER — Telehealth: Payer: Self-pay | Admitting: Medical

## 2019-04-05 DIAGNOSIS — C099 Malignant neoplasm of tonsil, unspecified: Secondary | ICD-10-CM | POA: Diagnosis not present

## 2019-04-05 DIAGNOSIS — R972 Elevated prostate specific antigen [PSA]: Secondary | ICD-10-CM

## 2019-04-05 DIAGNOSIS — C760 Malignant neoplasm of head, face and neck: Secondary | ICD-10-CM

## 2019-04-05 HISTORY — DX: Malignant neoplasm of tonsil, unspecified: C09.9

## 2019-04-05 LAB — HIV ANTIBODY (ROUTINE TESTING W REFLEX): HIV 1&2 Ab, 4th Generation: NONREACTIVE

## 2019-04-05 LAB — HEMOGLOBIN A1C: Hgb A1c MFr Bld: 5.3 % (ref 4.6–6.5)

## 2019-04-05 NOTE — Telephone Encounter (Signed)
Oncology Nurse Navigator Documentation  Called Mr. Utt in follow-up to this morning's surgical consult with Dr. Nicolette Bang [facility].    He stated he has decided to move forward with chemoRT d/t surgical risks and likelihood he would need chemoRT if he did have surgery.   He voiced understanding I will coordinate appt for CT SIM, inform both Drs. Tillie Fantasia of his decision.  He confirmed understanding of appt with Dr. Maylon Peppers 11/11 [facility].  We discussed value of PEG and port placement prior to starting tmt, he voiced understanding I will coordinate appt.  He agreed to 10:00 arrival to Radiation Waiting for [facility] appt with SLP Garald Balding.  Drs. Erasmo Downer and [place] provided update.  [name], RN, BSN Head & Neck Oncology Nurse [place] [facility] [place] [phone]

## 2019-04-05 NOTE — Telephone Encounter (Signed)
Referral to urologist placed. 

## 2019-04-06 ENCOUNTER — Telehealth (HOSPITAL_COMMUNITY): Payer: Self-pay

## 2019-04-06 ENCOUNTER — Ambulatory Visit: Payer: BC Managed Care – PPO | Attending: Radiation Oncology

## 2019-04-06 ENCOUNTER — Encounter: Payer: Self-pay | Admitting: *Deleted

## 2019-04-06 DIAGNOSIS — C099 Malignant neoplasm of tonsil, unspecified: Secondary | ICD-10-CM | POA: Diagnosis not present

## 2019-04-06 DIAGNOSIS — R1310 Dysphagia, unspecified: Secondary | ICD-10-CM | POA: Diagnosis not present

## 2019-04-06 NOTE — Patient Instructions (Signed)
SWALLOWING EXERCISES Do these 6 of the 7 days per week until 6 monhts after your last day of radiaiton, then 2 times per week afterwards  1. Effortful Swallows - Press your tongue against the roof of your mouth for 3 seconds, then squeeze          the muscles in your neck while you swallow your saliva or a sip of water - Repeat 10-15 times, 2-3 times a day, and use whenever you eat or drink  2. Masako Swallow - swallow with your tongue sticking out - Stick tongue out past your teeth and gently bite tongue with your teeth - Swallow, while holding your tongue with your teeth - Repeat 10-15 times, 2 times a day *use a wet spoon if your mouth gets dry*  3. Pitch Raise - Repeat "he", once per second in as high of a pitch as you can - Repeat 20 times, 2-3 times a day  4. Shaker Exercise - head lift - Lie flat on your back in your bed or on a couch without pillows - Raise your head and look at your feet - KEEP YOUR SHOULDERS DOWN - HOLD FOR 60 SECONDS, then lower your head back down - Repeat 3 times, 2 times a day  5. Mendelsohn Maneuver - "half swallow" exercise - Start to swallow, and keep your Adam's apple up by squeezing hard with the            muscles of the throat - Hold the squeeze for 5-7 seconds and then relax - Repeat 10-15 times, 2 times a day *use a wet spoon if your mouth gets dry*  6. Chin pushback ; do when swallowing is challenging - Open your mouth  - Place your fist UNDER your chin near your neck, and push back with your fist for 5 seconds - Repeat 10 times, 2 times a day

## 2019-04-06 NOTE — Therapy (Signed)
[facility] [address] [place], [place], [address] Phone: [phone]   Fax:  [phone]  Speech Language Pathology Evaluation  Patient Details  Name: Benjamin Mccullough MRN: [id number] Date of Birth: 12/04/1960 Referring Provider (SLP): Eppie Gibson, MD   Encounter Date: 04/06/2019  End of Session - 04/06/19 1235    Visit Number  1    Number of Visits  7    Date for SLP Re-Evaluation  07/05/19    SLP Start Time  1010    SLP Stop Time   1050    SLP Time Calculation (min)  40 min    Activity Tolerance  Patient tolerated treatment well       Past Medical History:  Diagnosis Date  . Borderline hypertension   . Cancer ([place])   . Hypertension     Past Surgical History:  Procedure Laterality Date  . ADENOIDECTOMY    . KNEE ARTHROSCOPY W/ ACL RECONSTRUCTION      There were no vitals filed for this visit.  Subjective Assessment - 04/06/19 1228    Subjective  Pt denies overt s/sx oral or pharyngeal swallowing dysfunction.    Currently in Pain?  No/denies         SLP Evaluation [facility] - 04/06/19 1228      SLP Visit Information   SLP Received On  04/06/19    Referring Provider (SLP)  Eppie Gibson, MD    Onset Date  Spring 2020    Medical Diagnosis  lt tonsilar CA      Subjective   Patient/Family Stated Goal  Maintain WNL swallowing function post-chemoRT      General Information   HPI  Presented to ENT following routine dental checkup which noted nodule. Over the next 3 months nodule  had grown with lt neck mass. Neck CT 03-10-19 prominent lt level 2 lymphadenopathy with nodes measuring up to 3.7 cm. Biopsy of lt neck mass 03-14-19 revelaing mlignant cells consistent wiht SCC. PET on 03-21-19 revealed hypermetabolic lesion in lt posterior lateral oropharynx localizaing to lt palatine tonsil region with two enlarged and jypermetabolic lt level 2 nodes.       Prior Functional Status   Cognitive/Linguistic  Baseline  Within functional limits      Cognition   Overall Cognitive Status  Within Functional Limits for tasks assessed      Auditory Comprehension   Overall Auditory Comprehension  Appears within functional limits for tasks assessed      Oral Motor/Sensory Function   Overall Oral Motor/Sensory Function  Appears within functional limits for tasks assessed    Lingual ROM  Within Functional Limits    Lingual Symmetry  Within Functional Limits    Lingual Strength  Within Functional Limits    Lingual Coordination  WFL      Motor Speech   Overall Motor Speech  Appears within functional limits for tasks assessed       Pt currently tolerates regular diet with thin liquids.  POs: See below in "clinical impression statement" re: throat clear x1. Otherwise, pt is without overt s/sx aspiration. Thyroid elevation appeared adequate, and swallows appeared timely. Pt's swallow deemed WNL/WFL at this time.   Because data states the risk for dysphagia during and after radiation treatment is high due to undergoing radiation tx, SLP taught pt about the possibility of reduced/limited ability for PO intake during rad tx. SLP encouraged pt to continue swallowing POs as far into rad tx as possible, even ingesting  POs and/or completing HEP shortly after administration of pain meds.   SLP educated pt re: changes to swallowing musculature after rad tx, and why adherence to dysphagia HEP provided today and PO consumption was necessary to inhibit muscular disuse atrophy and to reduce muscle fibrosis following rad tx. Pt demonstrated understanding of these things to SLP.    SLP then developed a HEP for pt and pt was instructed how to perform exercises involving lingual, vocal, and pharyngeal strengthening. SLP performed each exercise and pt return demonstrated each exercise. SLP ensured pt performance was correct prior to moving on to next exercise. Pt was instructed to complete this program 2 times a day, 6  days/week until 6 months after his or her last rad tx, then x2 a week after that.                 SLP Education - 04/06/19 1235    Education Details  HEP procedure, late effects head/neck radiation on swallow funciton    Person(s) Educated  Patient    Methods  Explanation;Demonstration;Verbal cues;Handout    Comprehension  Verbalized understanding;Returned demonstration;Verbal cues required;Need further instruction       SLP Short Term Goals - 04/06/19 1238      SLP SHORT TERM GOAL #1   Title  Pt will tell SLP rationale for HEP completion in two sessions    Time  2    Period  --   sessions   Status  New      SLP SHORT TERM GOAL #2   Title  pt will demo proper procedure for HEP completion with rare min A in two sessions    Time  2    Period  --   sessions     SLP SHORT TERM GOAL #3   Title  Pt will tell SLP how a food journal can hasten/facilitate return to more normalized diet    Time  2    Period  --   sesisons   Status  New       SLP Long Term Goals - 04/06/19 1239      SLP LONG TERM GOAL #1   Title  Pt will complete HEP with modified independence over 3 visits    Time  6    Period  --   sessions   Status  New      SLP LONG TERM GOAL #2   Title  Pt will tell SLP when to decr frequency of HEP over two sessions    Time  5    Period  --   sessions   Status  New      SLP LONG TERM GOAL #3   Title  Pt will tell SLP 3 overt s/s aspiration PNA with modified independence    Time  4    Period  --   sessions   Status  New       Plan - 04/06/19 1236    Clinical Impression Statement  Pt presents today with WNL/WFL swallowing ability pre-rad tx. Pt cleared throat after 1/8 boluses, thought to be incidental and not indicative of oropharngeal dysphagia. No overt s/sx aspiration PNA reported or observed today. Data suggests that as pts progress through rad or chemorad therapy that their swallowing ability will decrease. Also, WNL swallowing is threatened  by muscle fibrosis that will likely develop after rad/chemorad is completed. Therefore, skilled ST would be beneficial to the pt in order to regularly assess pt's safety with POs and/or  need for instrumental swallow assessment, as well as to assess proper completion of HEP.    Speech Therapy Frequency  --   once approx every 4 weeks   Duration  --   7 total visits   Treatment/Interventions  Aspiration precaution training;Pharyngeal strengthening exercises;Diet toleration management by SLP;Internal/external aids;Patient/family education;Trials of upgraded texture/liquids;SLP instruction and feedback;Compensatory strategies    Potential to Achieve Goals  Good       Patient will benefit from skilled therapeutic intervention in order to improve the following deficits and impairments:   Dysphagia, unspecified type    Problem List Patient Active Problem List   Diagnosis Date Noted  . 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 Chest pain 04/04/2014  . Elevated blood pressure 04/04/2014    SCHINKE,CARL ,MS, CCC-SLP  04/06/2019, 12:41 PM  [facility] [address] [place], [place], [address] Phone: [phone]   Fax:  [phone]  Name: Marquale Pavlock MRN: [id number] Date of Birth: December 08, 1960

## 2019-04-06 NOTE — Progress Notes (Signed)
Oncology Nurse Navigator Documentation  Met with Benjamin Mccullough upon his arrival for H&N [name].    Provided verbal overview of [place], the clinicians who will be seeing him, encouraged him to ask questions during his time with them.  He was seen SLP Garald Balding.  Following MDC, met with to provide/review New Patient Packet: o Contact information for physician(s), myself, other members of the Care Team. o Advance Directive information ([place] blue pamphlet with LCSW contact info); provided [facility] AD booklet at his request, encouraged him to contact G[place]a [place] LCSW to complete. o Fall Prevention Patient [place] campus map with highlight of [place] o SLP information sheet o Symptom Management Clinic information . Provided and discussed educational handouts for PEG and PAC, explained purpose/rationale for both.  . Provided Epic appt calendar with recently scheduled appts for CT SIM, PEG/port placement.  He agreed to meet with me 10:00 Monday prior to COVID test at [facility].  He confirmed understanding of location. . I encouraged him to contact me with questions/concerns as treatments/procedures begin.   [name], RN, BSN Head & Neck Oncology Nurse [name] at [place] [phone]

## 2019-04-06 NOTE — Progress Notes (Signed)
error 

## 2019-04-06 NOTE — Telephone Encounter (Signed)
-----   Message from Arne Cleveland, MD sent at 04/06/2019  4:13 PM EST ----- Regarding: RE: Peg/Port [facility] 1st G tube can  follow same  day [place] window on PET/CT   DDH   ----- Message ----- From: Danielle Dess Sent: 04/06/2019   3:29 PM EST To: Ir Procedure Requests Subject: Peg/Port                                       Procedure: Peg and Port placement   Dx: Squamous cell carcinoma of head and neck   Ordering: Dr. Tish Men [phone]   Imaging: NM Pet 03/21/19   Please Review   Thanks,  Lia Foyer

## 2019-04-07 ENCOUNTER — Encounter: Payer: Self-pay | Admitting: *Deleted

## 2019-04-07 NOTE — Progress Notes (Signed)
Received a call from the patient requesting that all his appointments in the [facility] system be cancelled.  Patient sought out a second opinion at [facility] and felt that a trial they offered was a better fit for him. He was very complimentary of his care team here. He initially attempted to speak to [name], but he is out of the office today, and since he had my contact info as a navigator in the [employer] office, he contacted me.  I will cancel his appointments as he requests, and message sent to his care team so they are aware of his decision.

## 2019-04-10 ENCOUNTER — Ambulatory Visit: Payer: BC Managed Care – PPO | Admitting: Radiation Oncology

## 2019-04-10 ENCOUNTER — Ambulatory Visit: Payer: BC Managed Care – PPO

## 2019-04-10 ENCOUNTER — Other Ambulatory Visit (HOSPITAL_COMMUNITY): Payer: BC Managed Care – PPO

## 2019-04-11 ENCOUNTER — Inpatient Hospital Stay: Payer: BC Managed Care – PPO | Admitting: Hematology

## 2019-04-11 ENCOUNTER — Other Ambulatory Visit: Payer: BC Managed Care – PPO

## 2019-04-12 ENCOUNTER — Ambulatory Visit
Admission: RE | Admit: 2019-04-12 | Discharge: 2019-04-12 | Disposition: A | Discharge status: Home / Self Care | Payer: BC Managed Care – PPO | Source: Ambulatory Visit | Attending: Radiation Oncology | Admitting: Radiation Oncology

## 2019-04-12 ENCOUNTER — Ambulatory Visit: Payer: BC Managed Care – PPO | Admitting: Radiation Oncology

## 2019-04-12 DIAGNOSIS — Z510 Encounter for antineoplastic radiation therapy: Secondary | ICD-10-CM | POA: Diagnosis not present

## 2019-04-12 DIAGNOSIS — Z79899 Other long term (current) drug therapy: Secondary | ICD-10-CM | POA: Diagnosis not present

## 2019-04-12 DIAGNOSIS — C770 Secondary and unspecified malignant neoplasm of lymph nodes of head, face and neck: Secondary | ICD-10-CM | POA: Diagnosis not present

## 2019-04-12 DIAGNOSIS — C099 Malignant neoplasm of tonsil, unspecified: Secondary | ICD-10-CM | POA: Diagnosis not present

## 2019-04-14 ENCOUNTER — Ambulatory Visit (HOSPITAL_COMMUNITY): Payer: BC Managed Care – PPO

## 2019-04-14 ENCOUNTER — Ambulatory Visit (HOSPITAL_COMMUNITY): Admission: RE | Admit: 2019-04-14 | Payer: BC Managed Care – PPO | Source: Ambulatory Visit

## 2019-04-19 DIAGNOSIS — C099 Malignant neoplasm of tonsil, unspecified: Secondary | ICD-10-CM | POA: Diagnosis not present

## 2019-04-19 DIAGNOSIS — Z510 Encounter for antineoplastic radiation therapy: Secondary | ICD-10-CM | POA: Diagnosis not present

## 2019-04-20 DIAGNOSIS — C099 Malignant neoplasm of tonsil, unspecified: Secondary | ICD-10-CM | POA: Diagnosis not present

## 2019-04-20 DIAGNOSIS — Z510 Encounter for antineoplastic radiation therapy: Secondary | ICD-10-CM | POA: Diagnosis not present

## 2019-04-24 DIAGNOSIS — C099 Malignant neoplasm of tonsil, unspecified: Secondary | ICD-10-CM | POA: Diagnosis not present

## 2019-04-25 DIAGNOSIS — Z510 Encounter for antineoplastic radiation therapy: Secondary | ICD-10-CM | POA: Diagnosis not present

## 2019-04-25 DIAGNOSIS — Z5111 Encounter for antineoplastic chemotherapy: Secondary | ICD-10-CM | POA: Diagnosis not present

## 2019-04-25 DIAGNOSIS — C099 Malignant neoplasm of tonsil, unspecified: Secondary | ICD-10-CM | POA: Diagnosis not present

## 2019-04-26 DIAGNOSIS — C099 Malignant neoplasm of tonsil, unspecified: Secondary | ICD-10-CM | POA: Diagnosis not present

## 2019-04-26 DIAGNOSIS — Z510 Encounter for antineoplastic radiation therapy: Secondary | ICD-10-CM | POA: Diagnosis not present

## 2019-04-28 ENCOUNTER — Other Ambulatory Visit: Payer: Self-pay | Admitting: Medical

## 2019-04-28 DIAGNOSIS — C099 Malignant neoplasm of tonsil, unspecified: Secondary | ICD-10-CM | POA: Diagnosis not present

## 2019-04-28 DIAGNOSIS — Z510 Encounter for antineoplastic radiation therapy: Secondary | ICD-10-CM | POA: Diagnosis not present

## 2019-04-28 NOTE — Telephone Encounter (Signed)
Pt would like to be advised. Pt says that he is currently taking 2 BP medications, pt was put on hydrochlorothiazide (HYDRODIURIL) 25 MG tablet by the hospital, in addition to Losartan that PCP prescribed. Pt would like to know if PCP wants him to continue taking 2 BP medications? If so, pt is requesting refill for hydrochlorothiazide (HYDRODIURIL) 25 MG tablet    Pharmacy :[id number] - [place], [place]

## 2019-04-29 DIAGNOSIS — R638 Other symptoms and signs concerning food and fluid intake: Secondary | ICD-10-CM | POA: Diagnosis not present

## 2019-05-01 DIAGNOSIS — R1312 Dysphagia, oropharyngeal phase: Secondary | ICD-10-CM | POA: Diagnosis not present

## 2019-05-01 DIAGNOSIS — Z510 Encounter for antineoplastic radiation therapy: Secondary | ICD-10-CM | POA: Diagnosis not present

## 2019-05-01 DIAGNOSIS — R638 Other symptoms and signs concerning food and fluid intake: Secondary | ICD-10-CM | POA: Diagnosis not present

## 2019-05-01 DIAGNOSIS — C099 Malignant neoplasm of tonsil, unspecified: Secondary | ICD-10-CM | POA: Diagnosis not present

## 2019-05-01 NOTE — Telephone Encounter (Signed)
Pt returned call and stated he did have enough medications to last until his upcoming appt

## 2019-05-01 NOTE — Telephone Encounter (Signed)
Pt has appt scheduled 05/05/2019. Called patient and LM asking patient if they had enough medications to last until upcoming appt.

## 2019-05-02 DIAGNOSIS — C099 Malignant neoplasm of tonsil, unspecified: Secondary | ICD-10-CM | POA: Diagnosis not present

## 2019-05-02 DIAGNOSIS — Z510 Encounter for antineoplastic radiation therapy: Secondary | ICD-10-CM | POA: Diagnosis not present

## 2019-05-02 DIAGNOSIS — R1313 Dysphagia, pharyngeal phase: Secondary | ICD-10-CM | POA: Diagnosis not present

## 2019-05-02 DIAGNOSIS — R1312 Dysphagia, oropharyngeal phase: Secondary | ICD-10-CM | POA: Diagnosis not present

## 2019-05-03 DIAGNOSIS — C099 Malignant neoplasm of tonsil, unspecified: Secondary | ICD-10-CM | POA: Diagnosis not present

## 2019-05-03 DIAGNOSIS — Z510 Encounter for antineoplastic radiation therapy: Secondary | ICD-10-CM | POA: Diagnosis not present

## 2019-05-03 DIAGNOSIS — H9313 Tinnitus, bilateral: Secondary | ICD-10-CM | POA: Diagnosis not present

## 2019-05-03 DIAGNOSIS — R638 Other symptoms and signs concerning food and fluid intake: Secondary | ICD-10-CM | POA: Diagnosis not present

## 2019-05-04 ENCOUNTER — Ambulatory Visit: Payer: BC Managed Care – PPO | Admitting: Medical

## 2019-05-04 DIAGNOSIS — H9313 Tinnitus, bilateral: Secondary | ICD-10-CM | POA: Diagnosis not present

## 2019-05-04 DIAGNOSIS — R638 Other symptoms and signs concerning food and fluid intake: Secondary | ICD-10-CM | POA: Diagnosis not present

## 2019-05-04 DIAGNOSIS — Z510 Encounter for antineoplastic radiation therapy: Secondary | ICD-10-CM | POA: Diagnosis not present

## 2019-05-04 DIAGNOSIS — C099 Malignant neoplasm of tonsil, unspecified: Secondary | ICD-10-CM | POA: Diagnosis not present

## 2019-05-05 ENCOUNTER — Other Ambulatory Visit: Payer: Self-pay

## 2019-05-05 ENCOUNTER — Encounter: Payer: Self-pay | Admitting: Medical

## 2019-05-05 ENCOUNTER — Ambulatory Visit: Payer: BC Managed Care – PPO | Admitting: Medical

## 2019-05-05 VITALS — BP 145/99 | HR 94 | Temp 97.9°F | Resp 16 | Ht 74.0 in | Wt 205.8 lb

## 2019-05-05 DIAGNOSIS — C090 Malignant neoplasm of tonsillar fossa: Secondary | ICD-10-CM | POA: Diagnosis not present

## 2019-05-05 DIAGNOSIS — Z125 Encounter for screening for malignant neoplasm of prostate: Secondary | ICD-10-CM

## 2019-05-05 DIAGNOSIS — I10 Essential (primary) hypertension: Secondary | ICD-10-CM | POA: Diagnosis not present

## 2019-05-05 DIAGNOSIS — C099 Malignant neoplasm of tonsil, unspecified: Secondary | ICD-10-CM | POA: Diagnosis not present

## 2019-05-05 DIAGNOSIS — R972 Elevated prostate specific antigen [PSA]: Secondary | ICD-10-CM

## 2019-05-05 DIAGNOSIS — Z510 Encounter for antineoplastic radiation therapy: Secondary | ICD-10-CM | POA: Diagnosis not present

## 2019-05-05 MED ORDER — CHLORTHALIDONE 25 MG PO TABS: 25.0000 mg | ORAL_TABLET | Freq: Every day | ORAL | 3 refills | Status: DC

## 2019-05-05 NOTE — Patient Instructions (Signed)
Your bp were good earlier today but mild-moderate high here. Check bp daily and if not getting close to 130/90 then may need to increase losartan to 50 mg daily. Refilled chorthalidone 25 mg today.  For borderline elvated psa will repeat lab work today. Will likely refer you to urologist due to borderline elevated value.  For elevated lipid continue with exercise and diet. Advise krill oil or fish oil otc. May need to rx statin in future if numbers worsen.  Follow up in 2 weeks my chart update on bp reading. If bp controlled then recommend follow up in 2 months for repeat lipid panel.

## 2019-05-05 NOTE — Progress Notes (Signed)
Subjective:    Patient ID: Benjamin Mccullough, male    DOB: 27-Nov-1960, 58 y.o.   MRN: [id number]  HPI  Pt in for follow up.  Pt bp today in am and this afternoon at home(but high again when staff checked). He give me long list of bp readings which were controlled up until chemo started.  Pt lipid panel showed mild high at 125. He exercises a lot. Pt did not get  krill or fish oil. Some CAD in dad. Mom had one MI. Both were in their 80's   Pt bp states since he was doing chemo his bp was elevated. But he was getting 2 bags of fluid when he initially got radiation at oncologist office.(for squamous cell carcinoma of tonsils)     Review of Systems  Constitutional: Negative for chills, fatigue and fever.  Respiratory: Negative for cough, chest tightness, shortness of breath and wheezing.   Cardiovascular: Negative for chest pain and palpitations.  Gastrointestinal: Negative for abdominal pain.  Musculoskeletal: Negative for back pain.  Skin: Negative for rash.  Neurological: Negative for seizures, syncope, facial asymmetry and weakness.  Hematological: Negative for adenopathy. Does not bruise/bleed easily.  Psychiatric/Behavioral: Negative for dysphoric mood. The patient is not nervous/anxious.     Past Medical History:  Diagnosis Date  . Borderline hypertension   . Cancer ([place])   . Hypertension      Social History   Socioeconomic History  . Marital status: Married    Spouse name: Not on file  . Number of children: 2  . Years of education: Not on file  . Highest education level: Not on file  Occupational History    Comment: Fire department  Social Needs  . Financial resource strain: Not on file  . Food insecurity    Worry: Not on file    Inability: Not on file  . Transportation needs    Medical: No    Non-medical: No  Tobacco Use  . Smoking status: Never Smoker  . Smokeless tobacco: Never Used  Substance and Sexual Activity  . Alcohol use: Not Currently   Alcohol/week: 0.0 standard drinks  . Drug use: No  . Sexual activity: Yes  Lifestyle  . Physical activity    Days per week: Not on file    Minutes per session: Not on file  . Stress: Not on file  Relationships  . Social Herbalist on phone: Not on file    Gets together: Not on file    Attends religious service: Not on file    Active member of club or organization: Not on file    Attends meetings of clubs or organizations: Not on file    Relationship status: Not on file  . Intimate partner violence    Fear of current or ex partner: No    Emotionally abused: No    Physically abused: No    Forced sexual activity: No  Other Topics Concern  . Not on file  Social History Narrative  . Not on file    Past Surgical History:  Procedure Laterality Date  . ADENOIDECTOMY    . KNEE ARTHROSCOPY W/ ACL RECONSTRUCTION      Family History  Problem Relation Age of Onset  . CAD Father        CABG in his 40s  . CAD Mother        Stents in her 38s    Allergies  Allergen Reactions  . Erythromycin Base Other (  See Comments)    Childhood reaction  Pt has taken Azithromycin with no issues (ZPAK)  . Penicillins     Did it involve swelling of the face/tongue/throat, SOB, or low BP? Unknown Did it involve sudden or severe rash/hives, skin peeling, or any reaction on the inside of your mouth or nose? Unknown Did you need to seek medical attention at a hospital or doctor's office? Unknown When did it last happen?childhood  If all above answers are "NO", may proceed with cephalosporin use.     Current Outpatient Medications on File Prior to Visit  Medication Sig Dispense Refill  . dexamethasone (DECADRON) 4 MG tablet Take 2 tablets by mouth daily for three days after chemotherapy (Wed, Thurs, Fri).    . hydrochlorothiazide (HYDRODIURIL) 25 MG tablet Take 1 tablet (25 mg total) by mouth daily. 30 tablet 0  . ibuprofen (ADVIL) 200 MG tablet Take 200 mg by mouth every 6 (six)  hours as needed.    Marland Kitchen losartan (COZAAR) 25 MG tablet Take 1 tablet (25 mg total) by mouth daily. 30 tablet 3  . ondansetron (ZOFRAN) 8 MG tablet Take 8 mg by mouth every 8 (eight) hours as needed.    . pantoprazole (PROTONIX) 40 MG tablet     . prochlorperazine (COMPAZINE) 10 MG tablet Take 10 mg by mouth every 6 (six) hours as needed.     No current facility-administered medications on file prior to visit.     BP (!) 145/99   Pulse 94   Temp 97.9 F (36.6 C) (Temporal)   Resp 16   Ht 6\' 2"  (1.88 m)   Wt 205 lb 12.8 oz (93.4 kg)   BMI 26.42 kg/m       Objective:   Physical Exam  General Mental Status- Alert. General Appearance- Not in acute distress.   Skin General: Color- Normal Color. Moisture- Normal Moisture.  Neck Carotid Arteries- Normal color. Moisture- Normal Moisture. No carotid bruits. No JVD.  Chest and Lung Exam Auscultation: Breath Sounds:-Normal.  Cardiovascular Auscultation:Rythm- Regular. Murmurs & Other Heart Sounds:Auscultation of the heart reveals- No Murmurs.  Abdomen Inspection:-Inspeection Normal. Palpation/Percussion:Note:No mass. Palpation and Percussion of the abdomen reveal- Non Tender, Non Distended + BS, no rebound or guarding.    Neurologic Cranial Nerve exam:- CN III-XII intact(No nystagmus), symmetric smile. Strength:- 5/5 equal and symmetric strength both upper and lower extremities.      Assessment & Plan:  Your bp were good earlier today but mild-moderate high here. Check bp daily and if not getting close to 130/90 then may need to increase losartan to 50 mg daily. Refilled chorthalidone 25 mg today.  For borderline elvated psa will repeat lab work today. Will likely refer you to urologist due to borderline elevated value.  For elevated lipid continue with exercise and diet. Advise krill oil or fish oil otc. May need to rx statin in future if numbers worsen.  Follow up in 2 weeks my chart update on bp reading. If bp  controlled then recommend follow up in 2 months for repeat lipid panel.  25 minutes spent with pt. 50% of time spent counseling on plan going forward.  Mackie Pai, PA-C

## 2019-05-06 LAB — PSA: PSA: 2.8 ng/mL (ref <=4.0)

## 2019-05-07 ENCOUNTER — Telehealth: Payer: Self-pay | Admitting: Medical

## 2019-05-07 DIAGNOSIS — Z125 Encounter for screening for malignant neoplasm of prostate: Secondary | ICD-10-CM

## 2019-05-07 DIAGNOSIS — Z87898 Personal history of other specified conditions: Secondary | ICD-10-CM

## 2019-05-07 NOTE — Telephone Encounter (Signed)
Future psa placed. 

## 2019-05-08 ENCOUNTER — Telehealth: Payer: Self-pay | Admitting: Medical

## 2019-05-08 DIAGNOSIS — Z510 Encounter for antineoplastic radiation therapy: Secondary | ICD-10-CM | POA: Diagnosis not present

## 2019-05-08 DIAGNOSIS — R1313 Dysphagia, pharyngeal phase: Secondary | ICD-10-CM | POA: Diagnosis not present

## 2019-05-08 DIAGNOSIS — R1312 Dysphagia, oropharyngeal phase: Secondary | ICD-10-CM | POA: Diagnosis not present

## 2019-05-08 DIAGNOSIS — C099 Malignant neoplasm of tonsil, unspecified: Secondary | ICD-10-CM | POA: Diagnosis not present

## 2019-05-08 NOTE — Telephone Encounter (Signed)
I did get pt old Colonoscopy report summary letter. You had benign polyp in 2013 and your GI MD recommnended repeating in 2018/5 years later.   You saw Dr. Mitchell Heir with [facility]. Do you want to see them again or do you want to see [facility]. Either way fine with me just want you to get it done since 2 years past recommended date.

## 2019-05-09 DIAGNOSIS — C099 Malignant neoplasm of tonsil, unspecified: Secondary | ICD-10-CM | POA: Diagnosis not present

## 2019-05-09 DIAGNOSIS — Z510 Encounter for antineoplastic radiation therapy: Secondary | ICD-10-CM | POA: Diagnosis not present

## 2019-05-09 NOTE — Telephone Encounter (Signed)
Patient notified and he stated that he will wait to schedule this after he deals with all the cancer stuff he will get this scheduled.  Advised that he can simply call Dr. Marin Comment office or let us know if he wants to use a [facility].

## 2019-05-10 DIAGNOSIS — Z510 Encounter for antineoplastic radiation therapy: Secondary | ICD-10-CM | POA: Diagnosis not present

## 2019-05-10 DIAGNOSIS — C099 Malignant neoplasm of tonsil, unspecified: Secondary | ICD-10-CM | POA: Diagnosis not present

## 2019-05-11 DIAGNOSIS — R110 Nausea: Secondary | ICD-10-CM | POA: Diagnosis not present

## 2019-05-11 DIAGNOSIS — Z510 Encounter for antineoplastic radiation therapy: Secondary | ICD-10-CM | POA: Diagnosis not present

## 2019-05-11 DIAGNOSIS — R638 Other symptoms and signs concerning food and fluid intake: Secondary | ICD-10-CM | POA: Diagnosis not present

## 2019-05-11 DIAGNOSIS — E871 Hypo-osmolality and hyponatremia: Secondary | ICD-10-CM | POA: Diagnosis not present

## 2019-05-11 DIAGNOSIS — K219 Gastro-esophageal reflux disease without esophagitis: Secondary | ICD-10-CM | POA: Diagnosis not present

## 2019-05-11 DIAGNOSIS — Z79899 Other long term (current) drug therapy: Secondary | ICD-10-CM | POA: Diagnosis not present

## 2019-05-11 DIAGNOSIS — Z7952 Long term (current) use of systemic steroids: Secondary | ICD-10-CM | POA: Diagnosis not present

## 2019-05-11 DIAGNOSIS — C099 Malignant neoplasm of tonsil, unspecified: Secondary | ICD-10-CM | POA: Diagnosis not present

## 2019-05-12 DIAGNOSIS — Z510 Encounter for antineoplastic radiation therapy: Secondary | ICD-10-CM | POA: Diagnosis not present

## 2019-05-12 DIAGNOSIS — C099 Malignant neoplasm of tonsil, unspecified: Secondary | ICD-10-CM | POA: Diagnosis not present

## 2019-05-15 DIAGNOSIS — R1312 Dysphagia, oropharyngeal phase: Secondary | ICD-10-CM | POA: Diagnosis not present

## 2019-05-15 DIAGNOSIS — C099 Malignant neoplasm of tonsil, unspecified: Secondary | ICD-10-CM | POA: Diagnosis not present

## 2019-05-15 DIAGNOSIS — R1313 Dysphagia, pharyngeal phase: Secondary | ICD-10-CM | POA: Diagnosis not present

## 2019-05-15 DIAGNOSIS — Z510 Encounter for antineoplastic radiation therapy: Secondary | ICD-10-CM | POA: Diagnosis not present

## 2019-05-16 DIAGNOSIS — C099 Malignant neoplasm of tonsil, unspecified: Secondary | ICD-10-CM | POA: Diagnosis not present

## 2019-05-16 DIAGNOSIS — Z5111 Encounter for antineoplastic chemotherapy: Secondary | ICD-10-CM | POA: Diagnosis not present

## 2019-05-16 DIAGNOSIS — Z510 Encounter for antineoplastic radiation therapy: Secondary | ICD-10-CM | POA: Diagnosis not present

## 2019-05-17 DIAGNOSIS — R638 Other symptoms and signs concerning food and fluid intake: Secondary | ICD-10-CM | POA: Diagnosis not present

## 2019-05-17 DIAGNOSIS — Z510 Encounter for antineoplastic radiation therapy: Secondary | ICD-10-CM | POA: Diagnosis not present

## 2019-05-17 DIAGNOSIS — C099 Malignant neoplasm of tonsil, unspecified: Secondary | ICD-10-CM | POA: Diagnosis not present

## 2019-05-18 ENCOUNTER — Ambulatory Visit: Payer: BC Managed Care – PPO

## 2019-05-18 DIAGNOSIS — C099 Malignant neoplasm of tonsil, unspecified: Secondary | ICD-10-CM | POA: Diagnosis not present

## 2019-05-18 DIAGNOSIS — Z510 Encounter for antineoplastic radiation therapy: Secondary | ICD-10-CM | POA: Diagnosis not present

## 2019-05-19 DIAGNOSIS — R638 Other symptoms and signs concerning food and fluid intake: Secondary | ICD-10-CM | POA: Diagnosis not present

## 2019-05-19 DIAGNOSIS — Z510 Encounter for antineoplastic radiation therapy: Secondary | ICD-10-CM | POA: Diagnosis not present

## 2019-05-19 DIAGNOSIS — C099 Malignant neoplasm of tonsil, unspecified: Secondary | ICD-10-CM | POA: Diagnosis not present

## 2019-05-19 HISTORY — DX: Hypomagnesemia: E83.42

## 2019-05-22 DIAGNOSIS — R1312 Dysphagia, oropharyngeal phase: Secondary | ICD-10-CM | POA: Diagnosis not present

## 2019-05-22 DIAGNOSIS — R1313 Dysphagia, pharyngeal phase: Secondary | ICD-10-CM | POA: Diagnosis not present

## 2019-05-22 DIAGNOSIS — Z510 Encounter for antineoplastic radiation therapy: Secondary | ICD-10-CM | POA: Diagnosis not present

## 2019-05-22 DIAGNOSIS — C099 Malignant neoplasm of tonsil, unspecified: Secondary | ICD-10-CM | POA: Diagnosis not present

## 2019-05-23 DIAGNOSIS — K219 Gastro-esophageal reflux disease without esophagitis: Secondary | ICD-10-CM | POA: Diagnosis not present

## 2019-05-23 DIAGNOSIS — K1231 Oral mucositis (ulcerative) due to antineoplastic therapy: Secondary | ICD-10-CM | POA: Diagnosis not present

## 2019-05-23 DIAGNOSIS — Z7952 Long term (current) use of systemic steroids: Secondary | ICD-10-CM | POA: Diagnosis not present

## 2019-05-23 DIAGNOSIS — R110 Nausea: Secondary | ICD-10-CM | POA: Diagnosis not present

## 2019-05-23 DIAGNOSIS — C099 Malignant neoplasm of tonsil, unspecified: Secondary | ICD-10-CM | POA: Diagnosis not present

## 2019-05-23 DIAGNOSIS — R638 Other symptoms and signs concerning food and fluid intake: Secondary | ICD-10-CM | POA: Diagnosis not present

## 2019-05-23 DIAGNOSIS — Z510 Encounter for antineoplastic radiation therapy: Secondary | ICD-10-CM | POA: Diagnosis not present

## 2019-05-23 DIAGNOSIS — Z79899 Other long term (current) drug therapy: Secondary | ICD-10-CM | POA: Diagnosis not present

## 2019-05-23 DIAGNOSIS — T451X5A Adverse effect of antineoplastic and immunosuppressive drugs, initial encounter: Secondary | ICD-10-CM | POA: Diagnosis not present

## 2019-05-23 DIAGNOSIS — Z923 Personal history of irradiation: Secondary | ICD-10-CM | POA: Diagnosis not present

## 2019-05-24 DIAGNOSIS — R638 Other symptoms and signs concerning food and fluid intake: Secondary | ICD-10-CM | POA: Diagnosis not present

## 2019-05-24 DIAGNOSIS — C099 Malignant neoplasm of tonsil, unspecified: Secondary | ICD-10-CM | POA: Diagnosis not present

## 2019-05-24 DIAGNOSIS — Z510 Encounter for antineoplastic radiation therapy: Secondary | ICD-10-CM | POA: Diagnosis not present

## 2019-05-25 DIAGNOSIS — Z510 Encounter for antineoplastic radiation therapy: Secondary | ICD-10-CM | POA: Diagnosis not present

## 2019-05-25 DIAGNOSIS — C099 Malignant neoplasm of tonsil, unspecified: Secondary | ICD-10-CM | POA: Diagnosis not present

## 2019-05-27 DIAGNOSIS — R110 Nausea: Secondary | ICD-10-CM | POA: Diagnosis not present

## 2019-05-27 DIAGNOSIS — R638 Other symptoms and signs concerning food and fluid intake: Secondary | ICD-10-CM | POA: Diagnosis not present

## 2019-05-27 DIAGNOSIS — C099 Malignant neoplasm of tonsil, unspecified: Secondary | ICD-10-CM | POA: Diagnosis not present

## 2019-05-29 DIAGNOSIS — R1313 Dysphagia, pharyngeal phase: Secondary | ICD-10-CM | POA: Diagnosis not present

## 2019-05-29 DIAGNOSIS — R1312 Dysphagia, oropharyngeal phase: Secondary | ICD-10-CM | POA: Diagnosis not present

## 2019-05-29 DIAGNOSIS — Z510 Encounter for antineoplastic radiation therapy: Secondary | ICD-10-CM | POA: Diagnosis not present

## 2019-05-29 DIAGNOSIS — C099 Malignant neoplasm of tonsil, unspecified: Secondary | ICD-10-CM | POA: Diagnosis not present

## 2019-05-30 DIAGNOSIS — C099 Malignant neoplasm of tonsil, unspecified: Secondary | ICD-10-CM | POA: Diagnosis not present

## 2019-05-30 DIAGNOSIS — Z79899 Other long term (current) drug therapy: Secondary | ICD-10-CM | POA: Diagnosis not present

## 2019-05-30 DIAGNOSIS — Z923 Personal history of irradiation: Secondary | ICD-10-CM | POA: Diagnosis not present

## 2019-05-30 DIAGNOSIS — Z5111 Encounter for antineoplastic chemotherapy: Secondary | ICD-10-CM | POA: Diagnosis not present

## 2019-05-30 DIAGNOSIS — Z510 Encounter for antineoplastic radiation therapy: Secondary | ICD-10-CM | POA: Diagnosis not present

## 2019-05-30 DIAGNOSIS — K1230 Oral mucositis (ulcerative), unspecified: Secondary | ICD-10-CM | POA: Diagnosis not present

## 2019-05-31 DIAGNOSIS — C099 Malignant neoplasm of tonsil, unspecified: Secondary | ICD-10-CM | POA: Diagnosis not present

## 2019-05-31 DIAGNOSIS — Z510 Encounter for antineoplastic radiation therapy: Secondary | ICD-10-CM | POA: Diagnosis not present

## 2019-05-31 DIAGNOSIS — R638 Other symptoms and signs concerning food and fluid intake: Secondary | ICD-10-CM | POA: Diagnosis not present

## 2019-06-01 DIAGNOSIS — C099 Malignant neoplasm of tonsil, unspecified: Secondary | ICD-10-CM | POA: Diagnosis not present

## 2019-06-01 DIAGNOSIS — Z510 Encounter for antineoplastic radiation therapy: Secondary | ICD-10-CM | POA: Diagnosis not present

## 2019-06-03 DIAGNOSIS — R638 Other symptoms and signs concerning food and fluid intake: Secondary | ICD-10-CM | POA: Diagnosis not present

## 2019-06-03 DIAGNOSIS — R110 Nausea: Secondary | ICD-10-CM | POA: Diagnosis not present

## 2019-06-03 DIAGNOSIS — C099 Malignant neoplasm of tonsil, unspecified: Secondary | ICD-10-CM | POA: Diagnosis not present

## 2019-06-05 DIAGNOSIS — T451X5D Adverse effect of antineoplastic and immunosuppressive drugs, subsequent encounter: Secondary | ICD-10-CM | POA: Diagnosis not present

## 2019-06-05 DIAGNOSIS — R682 Dry mouth, unspecified: Secondary | ICD-10-CM | POA: Diagnosis not present

## 2019-06-05 DIAGNOSIS — Z6823 Body mass index (BMI) 23.0-23.9, adult: Secondary | ICD-10-CM | POA: Diagnosis not present

## 2019-06-05 DIAGNOSIS — Z510 Encounter for antineoplastic radiation therapy: Secondary | ICD-10-CM | POA: Diagnosis not present

## 2019-06-05 DIAGNOSIS — R1319 Other dysphagia: Secondary | ICD-10-CM | POA: Diagnosis not present

## 2019-06-05 DIAGNOSIS — K1230 Oral mucositis (ulcerative), unspecified: Secondary | ICD-10-CM | POA: Diagnosis not present

## 2019-06-05 DIAGNOSIS — Z79899 Other long term (current) drug therapy: Secondary | ICD-10-CM | POA: Diagnosis not present

## 2019-06-05 DIAGNOSIS — G893 Neoplasm related pain (acute) (chronic): Secondary | ICD-10-CM | POA: Diagnosis not present

## 2019-06-05 DIAGNOSIS — N179 Acute kidney failure, unspecified: Secondary | ICD-10-CM | POA: Diagnosis not present

## 2019-06-05 DIAGNOSIS — R634 Abnormal weight loss: Secondary | ICD-10-CM | POA: Diagnosis not present

## 2019-06-05 DIAGNOSIS — C099 Malignant neoplasm of tonsil, unspecified: Secondary | ICD-10-CM | POA: Diagnosis not present

## 2019-06-06 DIAGNOSIS — R682 Dry mouth, unspecified: Secondary | ICD-10-CM | POA: Diagnosis not present

## 2019-06-06 DIAGNOSIS — R1319 Other dysphagia: Secondary | ICD-10-CM | POA: Diagnosis not present

## 2019-06-06 DIAGNOSIS — G893 Neoplasm related pain (acute) (chronic): Secondary | ICD-10-CM | POA: Diagnosis not present

## 2019-06-06 DIAGNOSIS — R634 Abnormal weight loss: Secondary | ICD-10-CM | POA: Diagnosis not present

## 2019-06-06 DIAGNOSIS — K1230 Oral mucositis (ulcerative), unspecified: Secondary | ICD-10-CM | POA: Diagnosis not present

## 2019-06-06 DIAGNOSIS — C099 Malignant neoplasm of tonsil, unspecified: Secondary | ICD-10-CM | POA: Diagnosis not present

## 2019-06-06 DIAGNOSIS — K219 Gastro-esophageal reflux disease without esophagitis: Secondary | ICD-10-CM | POA: Diagnosis not present

## 2019-06-06 DIAGNOSIS — R638 Other symptoms and signs concerning food and fluid intake: Secondary | ICD-10-CM | POA: Diagnosis not present

## 2019-06-06 DIAGNOSIS — E860 Dehydration: Secondary | ICD-10-CM | POA: Diagnosis not present

## 2019-06-06 DIAGNOSIS — N179 Acute kidney failure, unspecified: Secondary | ICD-10-CM | POA: Diagnosis not present

## 2019-06-06 DIAGNOSIS — R7989 Other specified abnormal findings of blood chemistry: Secondary | ICD-10-CM | POA: Diagnosis not present

## 2019-06-06 DIAGNOSIS — R1312 Dysphagia, oropharyngeal phase: Secondary | ICD-10-CM | POA: Diagnosis not present

## 2019-06-06 DIAGNOSIS — Z6823 Body mass index (BMI) 23.0-23.9, adult: Secondary | ICD-10-CM | POA: Diagnosis not present

## 2019-06-06 DIAGNOSIS — Z79899 Other long term (current) drug therapy: Secondary | ICD-10-CM | POA: Diagnosis not present

## 2019-06-06 DIAGNOSIS — T451X5A Adverse effect of antineoplastic and immunosuppressive drugs, initial encounter: Secondary | ICD-10-CM | POA: Diagnosis not present

## 2019-06-06 DIAGNOSIS — R432 Parageusia: Secondary | ICD-10-CM | POA: Diagnosis not present

## 2019-06-06 DIAGNOSIS — Z923 Personal history of irradiation: Secondary | ICD-10-CM | POA: Diagnosis not present

## 2019-06-06 DIAGNOSIS — R112 Nausea with vomiting, unspecified: Secondary | ICD-10-CM | POA: Diagnosis not present

## 2019-06-06 DIAGNOSIS — D709 Neutropenia, unspecified: Secondary | ICD-10-CM | POA: Diagnosis not present

## 2019-06-06 DIAGNOSIS — T451X5D Adverse effect of antineoplastic and immunosuppressive drugs, subsequent encounter: Secondary | ICD-10-CM | POA: Diagnosis not present

## 2019-06-06 DIAGNOSIS — Z510 Encounter for antineoplastic radiation therapy: Secondary | ICD-10-CM | POA: Diagnosis not present

## 2019-06-06 HISTORY — DX: Gastro-esophageal reflux disease without esophagitis: K21.9

## 2019-06-07 DIAGNOSIS — R638 Other symptoms and signs concerning food and fluid intake: Secondary | ICD-10-CM | POA: Diagnosis not present

## 2019-06-07 DIAGNOSIS — R682 Dry mouth, unspecified: Secondary | ICD-10-CM | POA: Diagnosis not present

## 2019-06-07 DIAGNOSIS — R1319 Other dysphagia: Secondary | ICD-10-CM | POA: Diagnosis not present

## 2019-06-07 DIAGNOSIS — G893 Neoplasm related pain (acute) (chronic): Secondary | ICD-10-CM | POA: Diagnosis not present

## 2019-06-07 DIAGNOSIS — R634 Abnormal weight loss: Secondary | ICD-10-CM | POA: Diagnosis not present

## 2019-06-07 DIAGNOSIS — K1230 Oral mucositis (ulcerative), unspecified: Secondary | ICD-10-CM | POA: Diagnosis not present

## 2019-06-07 DIAGNOSIS — T451X5D Adverse effect of antineoplastic and immunosuppressive drugs, subsequent encounter: Secondary | ICD-10-CM | POA: Diagnosis not present

## 2019-06-07 DIAGNOSIS — N179 Acute kidney failure, unspecified: Secondary | ICD-10-CM | POA: Diagnosis not present

## 2019-06-07 DIAGNOSIS — Z6823 Body mass index (BMI) 23.0-23.9, adult: Secondary | ICD-10-CM | POA: Diagnosis not present

## 2019-06-07 DIAGNOSIS — C099 Malignant neoplasm of tonsil, unspecified: Secondary | ICD-10-CM | POA: Diagnosis not present

## 2019-06-07 DIAGNOSIS — Z79899 Other long term (current) drug therapy: Secondary | ICD-10-CM | POA: Diagnosis not present

## 2019-06-07 DIAGNOSIS — Z510 Encounter for antineoplastic radiation therapy: Secondary | ICD-10-CM | POA: Diagnosis not present

## 2019-06-08 DIAGNOSIS — R682 Dry mouth, unspecified: Secondary | ICD-10-CM | POA: Diagnosis not present

## 2019-06-08 DIAGNOSIS — Z6823 Body mass index (BMI) 23.0-23.9, adult: Secondary | ICD-10-CM | POA: Diagnosis not present

## 2019-06-08 DIAGNOSIS — G893 Neoplasm related pain (acute) (chronic): Secondary | ICD-10-CM | POA: Diagnosis not present

## 2019-06-08 DIAGNOSIS — R1319 Other dysphagia: Secondary | ICD-10-CM | POA: Diagnosis not present

## 2019-06-08 DIAGNOSIS — N179 Acute kidney failure, unspecified: Secondary | ICD-10-CM | POA: Diagnosis not present

## 2019-06-08 DIAGNOSIS — Z79899 Other long term (current) drug therapy: Secondary | ICD-10-CM | POA: Diagnosis not present

## 2019-06-08 DIAGNOSIS — R634 Abnormal weight loss: Secondary | ICD-10-CM | POA: Diagnosis not present

## 2019-06-08 DIAGNOSIS — K1230 Oral mucositis (ulcerative), unspecified: Secondary | ICD-10-CM | POA: Diagnosis not present

## 2019-06-08 DIAGNOSIS — C099 Malignant neoplasm of tonsil, unspecified: Secondary | ICD-10-CM | POA: Diagnosis not present

## 2019-06-08 DIAGNOSIS — Z510 Encounter for antineoplastic radiation therapy: Secondary | ICD-10-CM | POA: Diagnosis not present

## 2019-06-08 DIAGNOSIS — T451X5D Adverse effect of antineoplastic and immunosuppressive drugs, subsequent encounter: Secondary | ICD-10-CM | POA: Diagnosis not present

## 2019-06-09 DIAGNOSIS — R1319 Other dysphagia: Secondary | ICD-10-CM | POA: Diagnosis not present

## 2019-06-09 DIAGNOSIS — R682 Dry mouth, unspecified: Secondary | ICD-10-CM | POA: Diagnosis not present

## 2019-06-09 DIAGNOSIS — Z79899 Other long term (current) drug therapy: Secondary | ICD-10-CM | POA: Diagnosis not present

## 2019-06-09 DIAGNOSIS — R634 Abnormal weight loss: Secondary | ICD-10-CM | POA: Diagnosis not present

## 2019-06-09 DIAGNOSIS — C099 Malignant neoplasm of tonsil, unspecified: Secondary | ICD-10-CM | POA: Diagnosis not present

## 2019-06-09 DIAGNOSIS — N179 Acute kidney failure, unspecified: Secondary | ICD-10-CM | POA: Diagnosis not present

## 2019-06-09 DIAGNOSIS — G893 Neoplasm related pain (acute) (chronic): Secondary | ICD-10-CM | POA: Diagnosis not present

## 2019-06-09 DIAGNOSIS — Z510 Encounter for antineoplastic radiation therapy: Secondary | ICD-10-CM | POA: Diagnosis not present

## 2019-06-09 DIAGNOSIS — R638 Other symptoms and signs concerning food and fluid intake: Secondary | ICD-10-CM | POA: Diagnosis not present

## 2019-06-09 DIAGNOSIS — K1230 Oral mucositis (ulcerative), unspecified: Secondary | ICD-10-CM | POA: Diagnosis not present

## 2019-06-09 DIAGNOSIS — Z6823 Body mass index (BMI) 23.0-23.9, adult: Secondary | ICD-10-CM | POA: Diagnosis not present

## 2019-06-09 DIAGNOSIS — T451X5D Adverse effect of antineoplastic and immunosuppressive drugs, subsequent encounter: Secondary | ICD-10-CM | POA: Diagnosis not present

## 2019-06-10 DIAGNOSIS — R638 Other symptoms and signs concerning food and fluid intake: Secondary | ICD-10-CM | POA: Diagnosis not present

## 2019-06-12 DIAGNOSIS — K1230 Oral mucositis (ulcerative), unspecified: Secondary | ICD-10-CM | POA: Diagnosis not present

## 2019-06-12 DIAGNOSIS — G893 Neoplasm related pain (acute) (chronic): Secondary | ICD-10-CM | POA: Diagnosis not present

## 2019-06-12 DIAGNOSIS — Z510 Encounter for antineoplastic radiation therapy: Secondary | ICD-10-CM | POA: Diagnosis not present

## 2019-06-12 DIAGNOSIS — T451X5D Adverse effect of antineoplastic and immunosuppressive drugs, subsequent encounter: Secondary | ICD-10-CM | POA: Diagnosis not present

## 2019-06-12 DIAGNOSIS — N179 Acute kidney failure, unspecified: Secondary | ICD-10-CM | POA: Diagnosis not present

## 2019-06-12 DIAGNOSIS — C099 Malignant neoplasm of tonsil, unspecified: Secondary | ICD-10-CM | POA: Diagnosis not present

## 2019-06-12 DIAGNOSIS — R1319 Other dysphagia: Secondary | ICD-10-CM | POA: Diagnosis not present

## 2019-06-12 DIAGNOSIS — Z79899 Other long term (current) drug therapy: Secondary | ICD-10-CM | POA: Diagnosis not present

## 2019-06-12 DIAGNOSIS — Z6823 Body mass index (BMI) 23.0-23.9, adult: Secondary | ICD-10-CM | POA: Diagnosis not present

## 2019-06-12 DIAGNOSIS — R682 Dry mouth, unspecified: Secondary | ICD-10-CM | POA: Diagnosis not present

## 2019-06-12 DIAGNOSIS — R634 Abnormal weight loss: Secondary | ICD-10-CM | POA: Diagnosis not present

## 2019-06-13 DIAGNOSIS — R634 Abnormal weight loss: Secondary | ICD-10-CM | POA: Diagnosis not present

## 2019-06-13 DIAGNOSIS — R432 Parageusia: Secondary | ICD-10-CM | POA: Diagnosis not present

## 2019-06-13 DIAGNOSIS — R7989 Other specified abnormal findings of blood chemistry: Secondary | ICD-10-CM | POA: Diagnosis not present

## 2019-06-13 DIAGNOSIS — E860 Dehydration: Secondary | ICD-10-CM | POA: Diagnosis not present

## 2019-06-13 DIAGNOSIS — D61810 Antineoplastic chemotherapy induced pancytopenia: Secondary | ICD-10-CM | POA: Diagnosis not present

## 2019-06-13 DIAGNOSIS — N179 Acute kidney failure, unspecified: Secondary | ICD-10-CM | POA: Diagnosis not present

## 2019-06-13 DIAGNOSIS — Z6823 Body mass index (BMI) 23.0-23.9, adult: Secondary | ICD-10-CM | POA: Diagnosis not present

## 2019-06-13 DIAGNOSIS — R1312 Dysphagia, oropharyngeal phase: Secondary | ICD-10-CM | POA: Diagnosis not present

## 2019-06-13 DIAGNOSIS — R070 Pain in throat: Secondary | ICD-10-CM | POA: Diagnosis not present

## 2019-06-13 DIAGNOSIS — T451X5D Adverse effect of antineoplastic and immunosuppressive drugs, subsequent encounter: Secondary | ICD-10-CM | POA: Diagnosis not present

## 2019-06-13 DIAGNOSIS — K1230 Oral mucositis (ulcerative), unspecified: Secondary | ICD-10-CM | POA: Diagnosis not present

## 2019-06-13 DIAGNOSIS — C099 Malignant neoplasm of tonsil, unspecified: Secondary | ICD-10-CM | POA: Diagnosis not present

## 2019-06-13 DIAGNOSIS — Z510 Encounter for antineoplastic radiation therapy: Secondary | ICD-10-CM | POA: Diagnosis not present

## 2019-06-13 DIAGNOSIS — T451X5A Adverse effect of antineoplastic and immunosuppressive drugs, initial encounter: Secondary | ICD-10-CM | POA: Diagnosis not present

## 2019-06-13 DIAGNOSIS — K219 Gastro-esophageal reflux disease without esophagitis: Secondary | ICD-10-CM | POA: Diagnosis not present

## 2019-06-13 DIAGNOSIS — R682 Dry mouth, unspecified: Secondary | ICD-10-CM | POA: Diagnosis not present

## 2019-06-13 DIAGNOSIS — R1319 Other dysphagia: Secondary | ICD-10-CM | POA: Diagnosis not present

## 2019-06-13 DIAGNOSIS — Z79899 Other long term (current) drug therapy: Secondary | ICD-10-CM | POA: Diagnosis not present

## 2019-06-13 DIAGNOSIS — G893 Neoplasm related pain (acute) (chronic): Secondary | ICD-10-CM | POA: Diagnosis not present

## 2019-06-13 DIAGNOSIS — R638 Other symptoms and signs concerning food and fluid intake: Secondary | ICD-10-CM | POA: Diagnosis not present

## 2019-06-14 DIAGNOSIS — K1230 Oral mucositis (ulcerative), unspecified: Secondary | ICD-10-CM | POA: Diagnosis not present

## 2019-06-14 DIAGNOSIS — Z510 Encounter for antineoplastic radiation therapy: Secondary | ICD-10-CM | POA: Diagnosis not present

## 2019-06-14 DIAGNOSIS — Z79899 Other long term (current) drug therapy: Secondary | ICD-10-CM | POA: Diagnosis not present

## 2019-06-14 DIAGNOSIS — C099 Malignant neoplasm of tonsil, unspecified: Secondary | ICD-10-CM | POA: Diagnosis not present

## 2019-06-14 DIAGNOSIS — R634 Abnormal weight loss: Secondary | ICD-10-CM | POA: Diagnosis not present

## 2019-06-14 DIAGNOSIS — N179 Acute kidney failure, unspecified: Secondary | ICD-10-CM | POA: Diagnosis not present

## 2019-06-14 DIAGNOSIS — R1319 Other dysphagia: Secondary | ICD-10-CM | POA: Diagnosis not present

## 2019-06-14 DIAGNOSIS — G893 Neoplasm related pain (acute) (chronic): Secondary | ICD-10-CM | POA: Diagnosis not present

## 2019-06-14 DIAGNOSIS — R682 Dry mouth, unspecified: Secondary | ICD-10-CM | POA: Diagnosis not present

## 2019-06-14 DIAGNOSIS — Z6823 Body mass index (BMI) 23.0-23.9, adult: Secondary | ICD-10-CM | POA: Diagnosis not present

## 2019-06-14 DIAGNOSIS — T451X5D Adverse effect of antineoplastic and immunosuppressive drugs, subsequent encounter: Secondary | ICD-10-CM | POA: Diagnosis not present

## 2019-06-15 DIAGNOSIS — R112 Nausea with vomiting, unspecified: Secondary | ICD-10-CM | POA: Diagnosis not present

## 2019-06-15 DIAGNOSIS — Z510 Encounter for antineoplastic radiation therapy: Secondary | ICD-10-CM | POA: Diagnosis not present

## 2019-06-15 DIAGNOSIS — R7989 Other specified abnormal findings of blood chemistry: Secondary | ICD-10-CM | POA: Diagnosis not present

## 2019-06-15 DIAGNOSIS — R682 Dry mouth, unspecified: Secondary | ICD-10-CM | POA: Diagnosis not present

## 2019-06-15 DIAGNOSIS — N179 Acute kidney failure, unspecified: Secondary | ICD-10-CM | POA: Diagnosis not present

## 2019-06-15 DIAGNOSIS — R1319 Other dysphagia: Secondary | ICD-10-CM | POA: Diagnosis not present

## 2019-06-15 DIAGNOSIS — T451X5A Adverse effect of antineoplastic and immunosuppressive drugs, initial encounter: Secondary | ICD-10-CM | POA: Diagnosis not present

## 2019-06-15 DIAGNOSIS — T451X5D Adverse effect of antineoplastic and immunosuppressive drugs, subsequent encounter: Secondary | ICD-10-CM | POA: Diagnosis not present

## 2019-06-15 DIAGNOSIS — R638 Other symptoms and signs concerning food and fluid intake: Secondary | ICD-10-CM | POA: Diagnosis not present

## 2019-06-15 DIAGNOSIS — C099 Malignant neoplasm of tonsil, unspecified: Secondary | ICD-10-CM | POA: Diagnosis not present

## 2019-06-15 DIAGNOSIS — R634 Abnormal weight loss: Secondary | ICD-10-CM | POA: Diagnosis not present

## 2019-06-15 DIAGNOSIS — K1230 Oral mucositis (ulcerative), unspecified: Secondary | ICD-10-CM | POA: Diagnosis not present

## 2019-06-15 DIAGNOSIS — G893 Neoplasm related pain (acute) (chronic): Secondary | ICD-10-CM | POA: Diagnosis not present

## 2019-06-15 DIAGNOSIS — Z79899 Other long term (current) drug therapy: Secondary | ICD-10-CM | POA: Diagnosis not present

## 2019-06-15 DIAGNOSIS — Z6823 Body mass index (BMI) 23.0-23.9, adult: Secondary | ICD-10-CM | POA: Diagnosis not present

## 2019-06-17 DIAGNOSIS — R638 Other symptoms and signs concerning food and fluid intake: Secondary | ICD-10-CM | POA: Diagnosis not present

## 2019-06-22 DIAGNOSIS — T451X5D Adverse effect of antineoplastic and immunosuppressive drugs, subsequent encounter: Secondary | ICD-10-CM | POA: Diagnosis not present

## 2019-06-22 DIAGNOSIS — L589 Radiodermatitis, unspecified: Secondary | ICD-10-CM | POA: Diagnosis not present

## 2019-06-22 DIAGNOSIS — G893 Neoplasm related pain (acute) (chronic): Secondary | ICD-10-CM | POA: Diagnosis not present

## 2019-06-22 DIAGNOSIS — R638 Other symptoms and signs concerning food and fluid intake: Secondary | ICD-10-CM | POA: Diagnosis not present

## 2019-06-22 DIAGNOSIS — K219 Gastro-esophageal reflux disease without esophagitis: Secondary | ICD-10-CM | POA: Diagnosis not present

## 2019-06-22 DIAGNOSIS — C099 Malignant neoplasm of tonsil, unspecified: Secondary | ICD-10-CM | POA: Diagnosis not present

## 2019-06-22 DIAGNOSIS — R432 Parageusia: Secondary | ICD-10-CM | POA: Diagnosis not present

## 2019-06-22 DIAGNOSIS — R7989 Other specified abnormal findings of blood chemistry: Secondary | ICD-10-CM | POA: Diagnosis not present

## 2019-06-22 DIAGNOSIS — R070 Pain in throat: Secondary | ICD-10-CM | POA: Diagnosis not present

## 2019-06-22 DIAGNOSIS — R112 Nausea with vomiting, unspecified: Secondary | ICD-10-CM | POA: Diagnosis not present

## 2019-06-22 DIAGNOSIS — E860 Dehydration: Secondary | ICD-10-CM | POA: Diagnosis not present

## 2019-06-22 DIAGNOSIS — R1312 Dysphagia, oropharyngeal phase: Secondary | ICD-10-CM | POA: Diagnosis not present

## 2019-06-22 DIAGNOSIS — N179 Acute kidney failure, unspecified: Secondary | ICD-10-CM | POA: Diagnosis not present

## 2019-06-22 DIAGNOSIS — Z923 Personal history of irradiation: Secondary | ICD-10-CM | POA: Diagnosis not present

## 2019-06-22 DIAGNOSIS — K1230 Oral mucositis (ulcerative), unspecified: Secondary | ICD-10-CM | POA: Diagnosis not present

## 2019-06-22 DIAGNOSIS — D61810 Antineoplastic chemotherapy induced pancytopenia: Secondary | ICD-10-CM | POA: Diagnosis not present

## 2019-06-22 DIAGNOSIS — E876 Hypokalemia: Secondary | ICD-10-CM | POA: Insufficient documentation

## 2019-06-22 HISTORY — DX: Hypokalemia: E87.6

## 2019-06-29 DIAGNOSIS — C099 Malignant neoplasm of tonsil, unspecified: Secondary | ICD-10-CM | POA: Diagnosis not present

## 2019-06-29 DIAGNOSIS — D61810 Antineoplastic chemotherapy induced pancytopenia: Secondary | ICD-10-CM | POA: Diagnosis not present

## 2019-06-29 DIAGNOSIS — R7989 Other specified abnormal findings of blood chemistry: Secondary | ICD-10-CM | POA: Diagnosis not present

## 2019-06-29 DIAGNOSIS — E876 Hypokalemia: Secondary | ICD-10-CM | POA: Diagnosis not present

## 2019-06-29 DIAGNOSIS — Z79899 Other long term (current) drug therapy: Secondary | ICD-10-CM | POA: Diagnosis not present

## 2019-06-29 DIAGNOSIS — R634 Abnormal weight loss: Secondary | ICD-10-CM | POA: Diagnosis not present

## 2019-06-29 DIAGNOSIS — N179 Acute kidney failure, unspecified: Secondary | ICD-10-CM | POA: Diagnosis not present

## 2019-06-29 DIAGNOSIS — R112 Nausea with vomiting, unspecified: Secondary | ICD-10-CM | POA: Diagnosis not present

## 2019-06-29 DIAGNOSIS — R638 Other symptoms and signs concerning food and fluid intake: Secondary | ICD-10-CM | POA: Diagnosis not present

## 2019-06-29 DIAGNOSIS — T451X5S Adverse effect of antineoplastic and immunosuppressive drugs, sequela: Secondary | ICD-10-CM | POA: Diagnosis not present

## 2019-06-29 DIAGNOSIS — G893 Neoplasm related pain (acute) (chronic): Secondary | ICD-10-CM | POA: Diagnosis not present

## 2019-06-29 DIAGNOSIS — R432 Parageusia: Secondary | ICD-10-CM | POA: Diagnosis not present

## 2019-06-29 DIAGNOSIS — D61818 Other pancytopenia: Secondary | ICD-10-CM | POA: Diagnosis not present

## 2019-06-29 DIAGNOSIS — K1230 Oral mucositis (ulcerative), unspecified: Secondary | ICD-10-CM | POA: Diagnosis not present

## 2019-07-06 DIAGNOSIS — C099 Malignant neoplasm of tonsil, unspecified: Secondary | ICD-10-CM | POA: Diagnosis not present

## 2019-07-06 DIAGNOSIS — E876 Hypokalemia: Secondary | ICD-10-CM | POA: Diagnosis not present

## 2019-07-06 DIAGNOSIS — K1230 Oral mucositis (ulcerative), unspecified: Secondary | ICD-10-CM | POA: Diagnosis not present

## 2019-07-06 DIAGNOSIS — R634 Abnormal weight loss: Secondary | ICD-10-CM | POA: Diagnosis not present

## 2019-07-06 DIAGNOSIS — G893 Neoplasm related pain (acute) (chronic): Secondary | ICD-10-CM | POA: Diagnosis not present

## 2019-07-06 DIAGNOSIS — D6481 Anemia due to antineoplastic chemotherapy: Secondary | ICD-10-CM | POA: Diagnosis not present

## 2019-07-06 DIAGNOSIS — R112 Nausea with vomiting, unspecified: Secondary | ICD-10-CM | POA: Diagnosis not present

## 2019-07-06 DIAGNOSIS — D72819 Decreased white blood cell count, unspecified: Secondary | ICD-10-CM | POA: Diagnosis not present

## 2019-07-07 ENCOUNTER — Encounter: Payer: Self-pay | Admitting: Medical

## 2019-07-07 ENCOUNTER — Ambulatory Visit: Payer: BC Managed Care – PPO | Admitting: Medical

## 2019-07-07 ENCOUNTER — Other Ambulatory Visit: Payer: Self-pay

## 2019-07-07 VITALS — BP 110/76 | HR 89 | Ht 74.0 in | Wt 182.6 lb

## 2019-07-07 DIAGNOSIS — I10 Essential (primary) hypertension: Secondary | ICD-10-CM | POA: Diagnosis not present

## 2019-07-07 DIAGNOSIS — R972 Elevated prostate specific antigen [PSA]: Secondary | ICD-10-CM

## 2019-07-07 DIAGNOSIS — Z125 Encounter for screening for malignant neoplasm of prostate: Secondary | ICD-10-CM | POA: Diagnosis not present

## 2019-07-07 DIAGNOSIS — C090 Malignant neoplasm of tonsillar fossa: Secondary | ICD-10-CM

## 2019-07-07 DIAGNOSIS — E785 Hyperlipidemia, unspecified: Secondary | ICD-10-CM

## 2019-07-07 DIAGNOSIS — D649 Anemia, unspecified: Secondary | ICD-10-CM

## 2019-07-07 NOTE — Patient Instructions (Addendum)
Your prostate protein is borderline elevated. Lower than it was. Will repeat psa next week. Then likely refer you to urologist.  For ldl increase will get lipid panel future next week.  For anemia recently will repeat cbc next week. Will have your oncologist review as it appears they should be aware of trend reviewing cbc in care everywhere.  Please contact you gi for colonosocopy follow up.  For htn continue current losartan and chorthalidone. Might be able to just give losartan 50 mg daily if you begin to exercise agressivley in the future.  Follow up 3 months or as needed

## 2019-07-07 NOTE — Progress Notes (Signed)
Subjective:    Patient ID: Benjamin Mccullough, male    DOB: 1960/10/08, 59 y.o.   MRN: [id number]  HPI  Pt in for mild high high hdl. That was 3 months ago. He is not fasting.  He had borderline psa elevation in November.  Pt states labs done with oncologist have been good. He states cmp done just yesterday. Kidney function is stable.  Pt states cbc have been stable. But he has had some mild anemia.  Pt has been to oncologist for tonillar ossa  Pt bp controlled. He is on on losartan and chlorthalidone. Most of time his bp has been around 130/80 on average approximate. Some lower and some higher but majority in controlled mid bp range.      Review of Systems  Constitutional: Negative for chills, fatigue and fever.  Respiratory: Negative for cough, chest tightness and wheezing.   Cardiovascular: Negative for chest pain.  Gastrointestinal: Negative for abdominal pain.  Genitourinary: Negative for decreased urine volume, difficulty urinating, flank pain, frequency, hematuria, penile swelling and testicular pain.  Musculoskeletal: Negative for back pain.  Skin: Negative for rash.  Hematological: Negative for adenopathy.  Psychiatric/Behavioral: Negative for behavioral problems, confusion, sleep disturbance and suicidal ideas.    Past Medical History:  Diagnosis Date  . Borderline hypertension   . Cancer ([place])   . Hypertension      Social History   Socioeconomic History  . Marital status: Married    Spouse name: Not on file  . Number of children: 2  . Years of education: Not on file  . Highest education level: Not on file  Occupational History    Comment: Fire department  Tobacco Use  . Smoking status: Never Smoker  . Smokeless tobacco: Never Used  Substance and Sexual Activity  . Alcohol use: Not Currently    Alcohol/week: 0.0 standard drinks  . Drug use: No  . Sexual activity: Yes  Other Topics Concern  . Not on file  Social History Narrative  . Not on file     Social Determinants of Health   Financial Resource Strain:   . Difficulty of Paying Living Expenses: Not on file  Food Insecurity:   . Worried About Charity fundraiser in the Last Year: Not on file  . Ran Out of Food in the Last Year: Not on file  Transportation Needs: No Transportation Needs  . Lack of Transportation (Medical): No  . Lack of Transportation (Non-Medical): No  Physical Activity:   . Days of Exercise per Week: Not on file  . Minutes of Exercise per Session: Not on file  Stress:   . Feeling of Stress : Not on file  Social Connections:   . Frequency of Communication with Friends and Family: Not on file  . Frequency of Social Gatherings with Friends and Family: Not on file  . Attends Religious Services: Not on file  . Active Member of Clubs or Organizations: Not on file  . Attends Archivist Meetings: Not on file  . Marital Status: Not on file  Intimate Partner Violence: Not At Risk  . Fear of Current or Ex-Partner: No  . Emotionally Abused: No  . Physically Abused: No  . Sexually Abused: No    Past Surgical History:  Procedure Laterality Date  . ADENOIDECTOMY    . KNEE ARTHROSCOPY W/ ACL RECONSTRUCTION      Family History  Problem Relation Age of Onset  . CAD Father  CABG in his 51s  . CAD Mother        Stents in her 49s    Allergies  Allergen Reactions  . Erythromycin Base Other (See Comments)    Childhood reaction  Pt has taken Azithromycin with no issues (ZPAK)  . Penicillins     Did it involve swelling of the face/tongue/throat, SOB, or low BP? Unknown Did it involve sudden or severe rash/hives, skin peeling, or any reaction on the inside of your mouth or nose? Unknown Did you need to seek medical attention at a hospital or doctor's office? Unknown When did it last happen?childhood  If all above answers are "NO", may proceed with cephalosporin use.     Current Outpatient Medications on File Prior to Visit   Medication Sig Dispense Refill  . chlorthalidone (HYGROTON) 25 MG tablet Take 1 tablet (25 mg total) by mouth daily. 30 tablet 3  . losartan (COZAAR) 25 MG tablet Take 1 tablet (25 mg total) by mouth daily. 30 tablet 3  . pantoprazole (PROTONIX) 40 MG tablet      No current facility-administered medications on file prior to visit.    BP 110/76   Pulse 89   Ht 6\' 2"  (1.88 m)   Wt 182 lb 9.6 oz (82.8 kg)   SpO2 99%   BMI 23.44 kg/m       Objective:   Physical Exam  General Mental Status- Alert. General Appearance- Not in acute distress.   Skin General: Color- Normal Color. Moisture- Normal Moisture.  Neck Carotid Arteries- Normal color. Moisture- Normal Moisture. No carotid bruits. No JVD.  Chest and Lung Exam Auscultation: Breath Sounds:-Normal.  Cardiovascular Auscultation:Rythm- Regular. Murmurs & Other Heart Sounds:Auscultation of the heart reveals- No Murmurs.  Abdomen Inspection:-Inspeection Normal. Palpation/Percussion:Note:No mass. Palpation and Percussion of the abdomen reveal- Non Tender, Non Distended + BS, no rebound or guarding.   Neurologic Cranial Nerve exam:- CN III-XII intact(No nystagmus), symmetric smile. Strength:- 5/5 equal and symmetric strength both upper and lower extremities.     Assessment & Plan:  Your prostate protein is borderline elevated. Lower than it was. Will repeat psa next week. Then likely refer you to urologist.  For ldl increase will get lipid panel future next week.  For anemia recently will repeat cbc next week. Will have your oncologist review as it appears they should be aware of trend reviewing cbc in care everywhere.  Please contact you gi for colonosocopy follow up.  For htn continue current losartan and chorthalidone. Might be able to just give losartan 50 mg daily if you begin to exercise agressivley in the future.  Follow up 3 months or as needed  30 minutes spent with pt or prn  Mackie Pai, PA-C

## 2019-07-12 DIAGNOSIS — R1312 Dysphagia, oropharyngeal phase: Secondary | ICD-10-CM | POA: Diagnosis not present

## 2019-07-13 ENCOUNTER — Other Ambulatory Visit: Payer: Self-pay

## 2019-07-13 ENCOUNTER — Other Ambulatory Visit (INDEPENDENT_AMBULATORY_CARE_PROVIDER_SITE_OTHER): Payer: BC Managed Care – PPO

## 2019-07-13 ENCOUNTER — Telehealth: Payer: Self-pay | Admitting: Medical

## 2019-07-13 DIAGNOSIS — R972 Elevated prostate specific antigen [PSA]: Secondary | ICD-10-CM

## 2019-07-13 DIAGNOSIS — E785 Hyperlipidemia, unspecified: Secondary | ICD-10-CM

## 2019-07-13 DIAGNOSIS — D649 Anemia, unspecified: Secondary | ICD-10-CM

## 2019-07-13 DIAGNOSIS — Z125 Encounter for screening for malignant neoplasm of prostate: Secondary | ICD-10-CM | POA: Diagnosis not present

## 2019-07-13 LAB — LIPID PANEL
Cholesterol: 224 mg/dL — ABNORMAL HIGH (ref 0–200)
HDL: 47.3 mg/dL (ref >39.00)
LDL Cholesterol: 159 mg/dL — ABNORMAL HIGH (ref 0–99)
NonHDL: 176.87
Total CHOL/HDL Ratio: 5
Triglycerides: 91 mg/dL (ref 0.0–149.0)
VLDL: 18.2 mg/dL (ref 0.0–40.0)

## 2019-07-13 LAB — CBC WITH DIFFERENTIAL/PLATELET
Basophils Absolute: 0 10*3/uL (ref 0.0–0.1)
Basophils Relative: 0.8 % (ref 0.0–3.0)
Eosinophils Absolute: 0 10*3/uL (ref 0.0–0.7)
Eosinophils Relative: 1.1 % (ref 0.0–5.0)
HCT: 31.1 % — ABNORMAL LOW (ref 39.0–52.0)
Hemoglobin: 10.9 g/dL — ABNORMAL LOW (ref 13.0–17.0)
Lymphocytes Relative: 20.4 % (ref 12.0–46.0)
Lymphs Abs: 0.4 10*3/uL — ABNORMAL LOW (ref 0.7–4.0)
MCHC: 35 g/dL (ref 30.0–36.0)
MCV: 96.7 fl (ref 78.0–100.0)
Monocytes Absolute: 0.3 10*3/uL (ref 0.1–1.0)
Monocytes Relative: 15.1 % — ABNORMAL HIGH (ref 3.0–12.0)
Neutro Abs: 1.3 10*3/uL — ABNORMAL LOW (ref 1.4–7.7)
Neutrophils Relative %: 62.6 % (ref 43.0–77.0)
Platelets: 274 10*3/uL (ref 150.0–400.0)
RBC: 3.21 Mil/uL — ABNORMAL LOW (ref 4.22–5.81)
RDW: 18 % — ABNORMAL HIGH (ref 11.5–15.5)
WBC: 2.1 10*3/uL — ABNORMAL LOW (ref 4.0–10.5)

## 2019-07-13 LAB — PSA: PSA: 6.19 ng/mL — ABNORMAL HIGH (ref 0.10–4.00)

## 2019-07-13 NOTE — Telephone Encounter (Signed)
Referral to urologist placed. 

## 2019-07-25 ENCOUNTER — Other Ambulatory Visit: Payer: Self-pay | Admitting: Medical

## 2019-07-26 DIAGNOSIS — R972 Elevated prostate specific antigen [PSA]: Secondary | ICD-10-CM | POA: Diagnosis not present

## 2019-07-26 DIAGNOSIS — N400 Enlarged prostate without lower urinary tract symptoms: Secondary | ICD-10-CM | POA: Diagnosis not present

## 2019-08-10 DIAGNOSIS — R634 Abnormal weight loss: Secondary | ICD-10-CM | POA: Diagnosis not present

## 2019-08-10 DIAGNOSIS — R432 Parageusia: Secondary | ICD-10-CM | POA: Diagnosis not present

## 2019-08-10 DIAGNOSIS — R070 Pain in throat: Secondary | ICD-10-CM | POA: Diagnosis not present

## 2019-08-10 DIAGNOSIS — R7989 Other specified abnormal findings of blood chemistry: Secondary | ICD-10-CM | POA: Diagnosis not present

## 2019-08-10 DIAGNOSIS — K219 Gastro-esophageal reflux disease without esophagitis: Secondary | ICD-10-CM | POA: Diagnosis not present

## 2019-08-10 DIAGNOSIS — G893 Neoplasm related pain (acute) (chronic): Secondary | ICD-10-CM | POA: Diagnosis not present

## 2019-08-10 DIAGNOSIS — Z923 Personal history of irradiation: Secondary | ICD-10-CM | POA: Diagnosis not present

## 2019-08-10 DIAGNOSIS — N179 Acute kidney failure, unspecified: Secondary | ICD-10-CM | POA: Diagnosis not present

## 2019-08-10 DIAGNOSIS — D6481 Anemia due to antineoplastic chemotherapy: Secondary | ICD-10-CM | POA: Diagnosis not present

## 2019-08-10 DIAGNOSIS — C099 Malignant neoplasm of tonsil, unspecified: Secondary | ICD-10-CM | POA: Diagnosis not present

## 2019-08-10 DIAGNOSIS — T451X5D Adverse effect of antineoplastic and immunosuppressive drugs, subsequent encounter: Secondary | ICD-10-CM | POA: Diagnosis not present

## 2019-08-10 DIAGNOSIS — D701 Agranulocytosis secondary to cancer chemotherapy: Secondary | ICD-10-CM | POA: Diagnosis not present

## 2019-08-10 DIAGNOSIS — R1312 Dysphagia, oropharyngeal phase: Secondary | ICD-10-CM | POA: Diagnosis not present

## 2019-08-10 DIAGNOSIS — R638 Other symptoms and signs concerning food and fluid intake: Secondary | ICD-10-CM | POA: Diagnosis not present

## 2019-08-22 ENCOUNTER — Other Ambulatory Visit: Payer: Self-pay | Admitting: Medical

## 2019-09-06 DIAGNOSIS — N400 Enlarged prostate without lower urinary tract symptoms: Secondary | ICD-10-CM | POA: Diagnosis not present

## 2019-09-08 DIAGNOSIS — C099 Malignant neoplasm of tonsil, unspecified: Secondary | ICD-10-CM | POA: Diagnosis not present

## 2019-09-08 DIAGNOSIS — R918 Other nonspecific abnormal finding of lung field: Secondary | ICD-10-CM | POA: Diagnosis not present

## 2019-09-08 DIAGNOSIS — R221 Localized swelling, mass and lump, neck: Secondary | ICD-10-CM | POA: Diagnosis not present

## 2019-09-13 DIAGNOSIS — C099 Malignant neoplasm of tonsil, unspecified: Secondary | ICD-10-CM | POA: Diagnosis not present

## 2019-09-14 DIAGNOSIS — R972 Elevated prostate specific antigen [PSA]: Secondary | ICD-10-CM | POA: Diagnosis not present

## 2019-09-14 DIAGNOSIS — N400 Enlarged prostate without lower urinary tract symptoms: Secondary | ICD-10-CM | POA: Diagnosis not present

## 2019-09-21 DIAGNOSIS — G893 Neoplasm related pain (acute) (chronic): Secondary | ICD-10-CM | POA: Diagnosis not present

## 2019-09-21 DIAGNOSIS — R638 Other symptoms and signs concerning food and fluid intake: Secondary | ICD-10-CM | POA: Diagnosis not present

## 2019-09-21 DIAGNOSIS — D72819 Decreased white blood cell count, unspecified: Secondary | ICD-10-CM | POA: Diagnosis not present

## 2019-09-21 DIAGNOSIS — C099 Malignant neoplasm of tonsil, unspecified: Secondary | ICD-10-CM | POA: Diagnosis not present

## 2019-09-21 DIAGNOSIS — Z79899 Other long term (current) drug therapy: Secondary | ICD-10-CM | POA: Diagnosis not present

## 2019-09-21 DIAGNOSIS — D649 Anemia, unspecified: Secondary | ICD-10-CM | POA: Diagnosis not present

## 2019-10-04 ENCOUNTER — Encounter: Payer: Self-pay | Admitting: Medical

## 2019-10-04 ENCOUNTER — Ambulatory Visit: Payer: BC Managed Care – PPO | Admitting: Medical

## 2019-10-04 ENCOUNTER — Other Ambulatory Visit: Payer: Self-pay

## 2019-10-04 VITALS — BP 140/80 | HR 74 | Resp 18 | Ht 74.0 in | Wt 134.4 lb

## 2019-10-04 DIAGNOSIS — C090 Malignant neoplasm of tonsillar fossa: Secondary | ICD-10-CM

## 2019-10-04 DIAGNOSIS — Z87898 Personal history of other specified conditions: Secondary | ICD-10-CM

## 2019-10-04 DIAGNOSIS — I10 Essential (primary) hypertension: Secondary | ICD-10-CM

## 2019-10-04 DIAGNOSIS — E785 Hyperlipidemia, unspecified: Secondary | ICD-10-CM | POA: Diagnosis not present

## 2019-10-04 DIAGNOSIS — R739 Hyperglycemia, unspecified: Secondary | ICD-10-CM

## 2019-10-04 DIAGNOSIS — D649 Anemia, unspecified: Secondary | ICD-10-CM

## 2019-10-04 DIAGNOSIS — M720 Palmar fascial fibromatosis [Dupuytren]: Secondary | ICD-10-CM

## 2019-10-04 LAB — LIPID PANEL
Cholesterol: 192 mg/dL (ref 0–200)
HDL: 56.8 mg/dL (ref >39.00)
LDL Cholesterol: 122 mg/dL — ABNORMAL HIGH (ref 0–99)
NonHDL: 134.87
Total CHOL/HDL Ratio: 3
Triglycerides: 64 mg/dL (ref 0.0–149.0)
VLDL: 12.8 mg/dL (ref 0.0–40.0)

## 2019-10-04 LAB — COMPREHENSIVE METABOLIC PANEL
ALT: 14 U/L (ref 0–53)
AST: 18 U/L (ref 0–37)
Albumin: 4.4 g/dL (ref 3.5–5.2)
Alkaline Phosphatase: 54 U/L (ref 39–117)
BUN: 25 mg/dL — ABNORMAL HIGH (ref 6–23)
CO2: 35 mEq/L — ABNORMAL HIGH (ref 19–32)
Calcium: 9.7 mg/dL (ref 8.4–10.5)
Chloride: 98 mEq/L (ref 96–112)
Creatinine, Ser: 1.46 mg/dL (ref 0.40–1.50)
GFR: 49.39 mL/min — ABNORMAL LOW (ref >60.00)
Glucose, Bld: 111 mg/dL — ABNORMAL HIGH (ref 70–99)
Potassium: 4.3 mEq/L (ref 3.5–5.1)
Sodium: 135 mEq/L (ref 135–145)
Total Bilirubin: 0.5 mg/dL (ref 0.2–1.2)
Total Protein: 6.7 g/dL (ref 6.0–8.3)

## 2019-10-04 LAB — MAGNESIUM: Magnesium: 1.8 mg/dL (ref 1.5–2.5)

## 2019-10-04 LAB — HEMOGLOBIN A1C: Hgb A1c MFr Bld: 5.6 % (ref 4.6–6.5)

## 2019-10-04 NOTE — Progress Notes (Signed)
Subjective:    Patient ID: Benjamin Mccullough, male    DOB: [birth year], 59 y.o.   MRN: [id number]  HPI  Pt in for follow up.  Pt states he got good new cancer free. Squamous cell CA tonsills. Imaging shows cancer free. So he is relieved.  Pt has htn. He states at home bp is 120/70. Today bp little higher. Pt is on losartan and chorthalidone. States transient brief dizziness at times bending ever. Also sometimes when does squats with very light weight.   Pt does have history of high blood sugar over past 1.5 years.  Pt has hx of gerd and had written in past. States never had to use protonix.Only brief gerd when he was on chemo  Pt has elevated cholesterol in the past.  Hx of anemia. On last visit wanted him to touch base with hematolgist/oncologist on anemia.   Hx of elevated psa. I had referred him to urologist. Pt has seen urologist. PSA came down to 3. He is going to be followed every year now.  Had both covid vaccine Phizer.    Review of Systems  Constitutional: Negative for chills, fatigue and fever.  Eyes: Negative for pain.  Respiratory: Negative for cough, chest tightness, shortness of breath and wheezing.   Cardiovascular: Negative for chest pain and palpitations.  Gastrointestinal: Negative for abdominal pain.  Musculoskeletal: Negative for back pain.  Neurological:       See hpi.  Hematological: Negative for adenopathy. Does not bruise/bleed easily.  Psychiatric/Behavioral: Negative for behavioral problems and decreased concentration.   Past Medical History:  Diagnosis Date  . Borderline hypertension   . Cancer ([place])   . Hypertension      Social History   Socioeconomic History  . Marital status: Married    Spouse name: Not on file  . Number of children: 2  . Years of education: Not on file  . Highest education level: Not on file  Occupational History    Comment: Fire department  Tobacco Use  . Smoking status: Never Smoker  . Smokeless tobacco: Never  Used  Substance and Sexual Activity  . Alcohol use: Not Currently    Alcohol/week: 0.0 standard drinks  . Drug use: No  . Sexual activity: Yes  Other Topics Concern  . Not on file  Social History Narrative  . Not on file   Social Determinants of Health   Financial Resource Strain:   . Difficulty of Paying Living Expenses:   Food Insecurity:   . Worried About Charity fundraiser in the Last Year:   . Arboriculturist in the Last Year:   Transportation Needs: No Transportation Needs  . Lack of Transportation (Medical): No  . Lack of Transportation (Non-Medical): No  Physical Activity:   . Days of Exercise per Week:   . Minutes of Exercise per Session:   Stress:   . Feeling of Stress :   Social Connections:   . Frequency of Communication with Friends and Family:   . Frequency of Social Gatherings with Friends and Family:   . Attends Religious Services:   . Active Member of Clubs or Organizations:   . Attends Archivist Meetings:   Marland Kitchen Marital Status:   Intimate Partner Violence: Not At Risk  . Fear of Current or Ex-Partner: No  . Emotionally Abused: No  . Physically Abused: No  . Sexually Abused: No    Past Surgical History:  Procedure Laterality Date  . ADENOIDECTOMY    .  KNEE ARTHROSCOPY W/ ACL RECONSTRUCTION      Family History  Problem Relation Age of Onset  . CAD Father        CABG in his 63s  . CAD Mother        Stents in her 32s    Allergies  Allergen Reactions  . Erythromycin Base Other (See Comments)    Childhood reaction  Pt has taken Azithromycin with no issues (ZPAK)  . Penicillins     Did it involve swelling of the face/tongue/throat, SOB, or low BP? Unknown Did it involve sudden or severe rash/hives, skin peeling, or any reaction on the inside of your mouth or nose? Unknown Did you need to seek medical attention at a hospital or doctor's office? Unknown When did it last happen?childhood  If all above answers are "NO", may proceed  with cephalosporin use.     Current Outpatient Medications on File Prior to Visit  Medication Sig Dispense Refill  . chlorthalidone (HYGROTON) 25 MG tablet TAKE 1 TABLET BY MOUTH EVERY DAY 90 tablet 1  . losartan (COZAAR) 25 MG tablet TAKE 1 TABLET BY MOUTH EVERY DAY 90 tablet 1  . pantoprazole (PROTONIX) 40 MG tablet      No current facility-administered medications on file prior to visit.    BP (!) 143/113 (BP Location: Right Arm, Patient Position: Sitting, Cuff Size: Normal) Comment: 145/88  Pulse 74   Resp 18   Ht 6\' 2"  (1.88 m)   Wt 134 lb 6.4 oz (61 kg)   SpO2 100%   BMI 17.26 kg/m       Objective:   Physical Exam  General Mental Status- Alert. General Appearance- Not in acute distress.   Skin General: Color- Normal Color. Moisture- Normal Moisture.  Neck Carotid Arteries- Normal color. Moisture- Normal Moisture. No carotid bruits. No JVD.  Chest and Lung Exam Auscultation: Breath Sounds:-Normal.  Cardiovascular Auscultation:Rythm- Regular. Murmurs & Other Heart Sounds:Auscultation of the heart reveals- No Murmurs.  Abdomen Inspection:-Inspeection Normal. Palpation/Percussion:Note:No mass. Palpation and Percussion of the abdomen reveal- Non Tender, Non Distended + BS, no rebound or guarding.   Neurologic Cranial Nerve exam:- CN III-XII intact(No nystagmus), symmetric smile. Strength:- 5/5 equal and symmetric strength both upper and lower extremities.      Assessment & Plan:  For htn continue current regimen. Bp better controlled at home and reasonable her.  Glad to hear good news regarding your history of tonsil cancer.  Continue to follow-up with oncologist.  For high cholesterol history, will get lipid panel today.  Hopefully lipids are better.  Glad to hear your former anemia.  Review of last lab work done with oncologist shows about three-point improvement of hemoglobin.  Continue follow-up with urologist for history of elevated PSA.  Glad to  hear that it came back down.  For elevated sugar history, will get A1c today.  For low magnesium level recently will repeat magnesium level.  You do not appear to have possible to pincher dupuytrens contracture of hand. Will refer to hand specialist to see if this is the case?  Follow up date to be determined after lab review.  Mackie Pai, PA-C   Time spent with patient today was 35 minutes which consisted of chart rediew, discussing diagnosis, work up, treatment and documentation.

## 2019-10-04 NOTE — Patient Instructions (Addendum)
For htn continue current regimen. Bp better controlled at home and reasonable her.  Glad to hear good news regarding your history of tonsil cancer.  Continue to follow-up with oncologist.  For high cholesterol history, will get lipid panel today.  Hopefully lipids are better.  Glad to hear your former anemia.  Review of last lab work done with oncologist shows about three-point improvement of hemoglobin.  Continue follow-up with urologist for history of elevated PSA.  Glad to hear that it came back down.  For elevated sugar history, will get A1c today.  For low magnesium level recently will repeat magnesium level.  You do not appear to have possible to pincher dupuytrens contracture of hand. Will refer to hand specialist to see if this is the case?   Follow up date to be determined after lab review.

## 2019-10-09 DIAGNOSIS — Z923 Personal history of irradiation: Secondary | ICD-10-CM | POA: Diagnosis not present

## 2019-10-09 DIAGNOSIS — R1312 Dysphagia, oropharyngeal phase: Secondary | ICD-10-CM | POA: Diagnosis not present

## 2019-10-10 ENCOUNTER — Ambulatory Visit: Payer: BC Managed Care – PPO | Admitting: Orthopaedic Surgery

## 2019-10-10 ENCOUNTER — Other Ambulatory Visit: Payer: Self-pay

## 2019-10-10 ENCOUNTER — Encounter: Payer: Self-pay | Admitting: Orthopaedic Surgery

## 2019-10-10 DIAGNOSIS — M720 Palmar fascial fibromatosis [Dupuytren]: Secondary | ICD-10-CM

## 2019-10-10 HISTORY — DX: Palmar fascial fibromatosis (dupuytren): M72.0

## 2019-10-10 NOTE — Progress Notes (Signed)
Office Visit Note   Patient: Benjamin Mccullough           Date of Birth: 05-Dec-1960           MRN: [id number] Visit Date: 10/10/2019              Requested by: Mackie Pai, PA-C [address] [place],  [place] [id number] PCP: Mackie Pai, PA-C   Assessment & Plan: Visit Diagnoses:  1. Dupuytren's contracture of both hands     Plan: At this time his Dupuytren's contracture is mild and he should just continue to do stretches on his own.  There is no need to intervene.  He understands that this is a chronically progressive condition that will continue to get worse that may need surgical intervention in the future.  Follow-Up Instructions: Return if symptoms worsen or fail to improve.   Orders:  No orders of the defined types were placed in this encounter.  No orders of the defined types were placed in this encounter.     Procedures: No procedures performed   Clinical Data: No additional findings.   Subjective: Chief Complaint  Patient presents with  . Right Little Finger - Pain  . 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  His PCP sent him here for evaluation.  He is cancer free from neck cancer.  He has cisplatin as part of his treatment and he is wondering if this is related.  He denies any pain other than when he bumps his hands on something.   Review of Systems  Constitutional: Negative.   All other systems reviewed and are negative.    Objective: Vital Signs: There were no vitals taken for this visit.  Physical Exam Vitals and nursing note reviewed.  Constitutional:      Appearance: He is well-developed.  HENT:     Head: Normocephalic and atraumatic.  Eyes:     Pupils: Pupils are equal, round, and reactive to light.  Pulmonary:     Effort: Pulmonary effort is normal.  Abdominal:     Palpations: Abdomen is soft.  Musculoskeletal:      General: Normal range of motion.     Cervical back: Neck supple.  Skin:    General: Skin is warm.  Neurological:     Mental Status: He is alert and oriented to person, place, and time.  Psychiatric:        Behavior: Behavior normal.        Thought Content: Thought content normal.        Judgment: Judgment normal.     Ortho Exam Bilateral hand exams are consistent with Dupuytren's cords of the ring and small fingers.  These are nontender to palpation.  He is able to put his hand flat on a tabletop.  He is able to make a full composite fist. Specialty Comments:  No specialty comments available.  Imaging: No results found.   PMFS History: Patient Active Problem List   Diagnosis Date Noted  . Dupuytren's contracture of both hands 10/10/2019  . Cancer of tonsillar fossa ([place]) 03/22/2019  . 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 Chest pain 04/04/2014  . Elevated blood pressure 04/04/2014   Past Medical History:  Diagnosis Date  . Borderline hypertension   . Cancer ([place])   . Hypertension     Family  History  Problem Relation Age of Onset  . CAD Father        CABG in his 28s  . CAD Mother        Stents in her 61s    Past Surgical History:  Procedure Laterality Date  . ADENOIDECTOMY    . KNEE ARTHROSCOPY W/ ACL RECONSTRUCTION     Social History   Occupational History    Comment: Fire department  Tobacco Use  . Smoking status: Never Smoker  . Smokeless tobacco: Never Used  Substance and Sexual Activity  . Alcohol use: Not Currently    Alcohol/week: 0.0 standard drinks  . Drug use: No  . Sexual activity: Yes

## 2019-12-19 DIAGNOSIS — Z5111 Encounter for antineoplastic chemotherapy: Secondary | ICD-10-CM | POA: Diagnosis not present

## 2019-12-19 DIAGNOSIS — I77810 Thoracic aortic ectasia: Secondary | ICD-10-CM | POA: Diagnosis not present

## 2019-12-19 DIAGNOSIS — R600 Localized edema: Secondary | ICD-10-CM | POA: Diagnosis not present

## 2019-12-19 DIAGNOSIS — I700 Atherosclerosis of aorta: Secondary | ICD-10-CM | POA: Diagnosis not present

## 2019-12-19 DIAGNOSIS — R590 Localized enlarged lymph nodes: Secondary | ICD-10-CM | POA: Diagnosis not present

## 2019-12-19 DIAGNOSIS — R911 Solitary pulmonary nodule: Secondary | ICD-10-CM | POA: Diagnosis not present

## 2019-12-19 DIAGNOSIS — C099 Malignant neoplasm of tonsil, unspecified: Secondary | ICD-10-CM | POA: Diagnosis not present

## 2019-12-21 DIAGNOSIS — D6481 Anemia due to antineoplastic chemotherapy: Secondary | ICD-10-CM | POA: Diagnosis not present

## 2019-12-21 DIAGNOSIS — R1312 Dysphagia, oropharyngeal phase: Secondary | ICD-10-CM | POA: Diagnosis not present

## 2019-12-21 DIAGNOSIS — I700 Atherosclerosis of aorta: Secondary | ICD-10-CM | POA: Diagnosis not present

## 2019-12-21 DIAGNOSIS — D72819 Decreased white blood cell count, unspecified: Secondary | ICD-10-CM | POA: Diagnosis not present

## 2019-12-21 DIAGNOSIS — Z79899 Other long term (current) drug therapy: Secondary | ICD-10-CM | POA: Diagnosis not present

## 2019-12-21 DIAGNOSIS — C099 Malignant neoplasm of tonsil, unspecified: Secondary | ICD-10-CM | POA: Diagnosis not present

## 2019-12-21 DIAGNOSIS — R638 Other symptoms and signs concerning food and fluid intake: Secondary | ICD-10-CM | POA: Diagnosis not present

## 2019-12-29 DIAGNOSIS — H5212 Myopia, left eye: Secondary | ICD-10-CM | POA: Diagnosis not present

## 2019-12-29 DIAGNOSIS — H1789 Other corneal scars and opacities: Secondary | ICD-10-CM | POA: Diagnosis not present

## 2020-02-13 ENCOUNTER — Other Ambulatory Visit: Payer: Self-pay | Admitting: Medical

## 2020-03-07 DIAGNOSIS — N400 Enlarged prostate without lower urinary tract symptoms: Secondary | ICD-10-CM | POA: Diagnosis not present

## 2020-03-14 DIAGNOSIS — R972 Elevated prostate specific antigen [PSA]: Secondary | ICD-10-CM | POA: Diagnosis not present

## 2020-03-14 DIAGNOSIS — N400 Enlarged prostate without lower urinary tract symptoms: Secondary | ICD-10-CM | POA: Diagnosis not present

## 2020-03-29 DIAGNOSIS — C099 Malignant neoplasm of tonsil, unspecified: Secondary | ICD-10-CM | POA: Diagnosis not present

## 2020-04-02 DIAGNOSIS — D485 Neoplasm of uncertain behavior of skin: Secondary | ICD-10-CM | POA: Diagnosis not present

## 2020-04-02 DIAGNOSIS — L821 Other seborrheic keratosis: Secondary | ICD-10-CM | POA: Diagnosis not present

## 2020-04-02 DIAGNOSIS — L570 Actinic keratosis: Secondary | ICD-10-CM | POA: Diagnosis not present

## 2020-04-02 DIAGNOSIS — C44311 Basal cell carcinoma of skin of nose: Secondary | ICD-10-CM | POA: Diagnosis not present

## 2020-04-02 DIAGNOSIS — C44712 Basal cell carcinoma of skin of right lower limb, including hip: Secondary | ICD-10-CM | POA: Diagnosis not present

## 2020-04-10 DIAGNOSIS — R1312 Dysphagia, oropharyngeal phase: Secondary | ICD-10-CM | POA: Diagnosis not present

## 2020-04-16 DIAGNOSIS — C44311 Basal cell carcinoma of skin of nose: Secondary | ICD-10-CM | POA: Diagnosis not present

## 2020-04-23 DIAGNOSIS — Z4802 Encounter for removal of sutures: Secondary | ICD-10-CM | POA: Diagnosis not present

## 2020-05-28 DIAGNOSIS — U071 COVID-19: Secondary | ICD-10-CM | POA: Diagnosis not present

## 2020-05-28 DIAGNOSIS — Z20822 Contact with and (suspected) exposure to covid-19: Secondary | ICD-10-CM | POA: Diagnosis not present

## 2020-08-15 IMAGING — CT CT HEAD W/O CM
3 series · 16 of 47 positions shown, 19 images · non-contrast
Comparison: None.

CLINICAL DATA: Intermittent headache and dizziness. Recent
diagnosis of head and neck squamous cell cancer.

EXAM:
CT HEAD WITHOUT CONTRAST
TECHNIQUE: Contiguous axial images were obtained from the base of the skull
through the vertex without intravenous contrast.

[Series 2: head wo · axial · 0.42mm/px · z∈[+232,+372]mm · 10 of 34 slices shown, 13 images]
[im 3/34  brain]
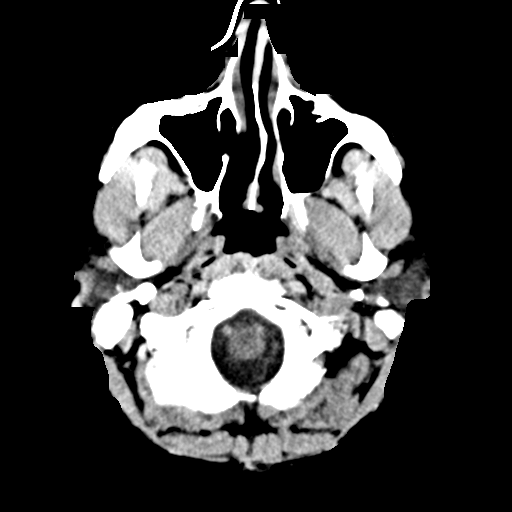
[im 3/34  bone]
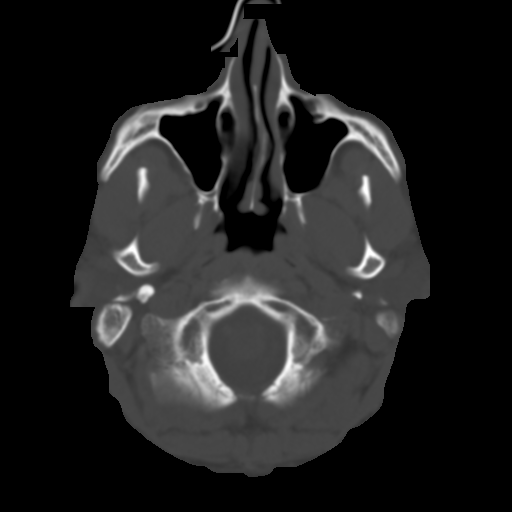
[im 6/34  brain]
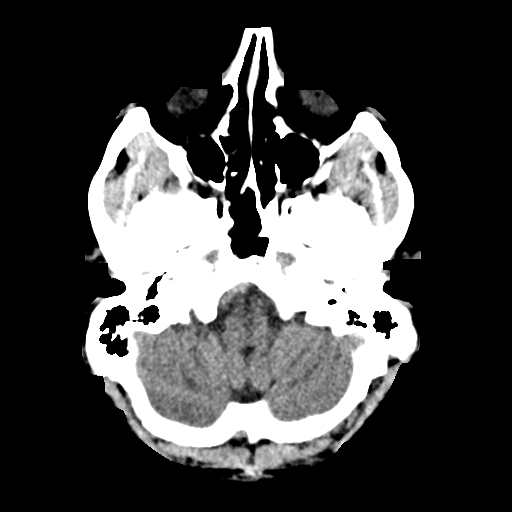
[im 10/34  brain]
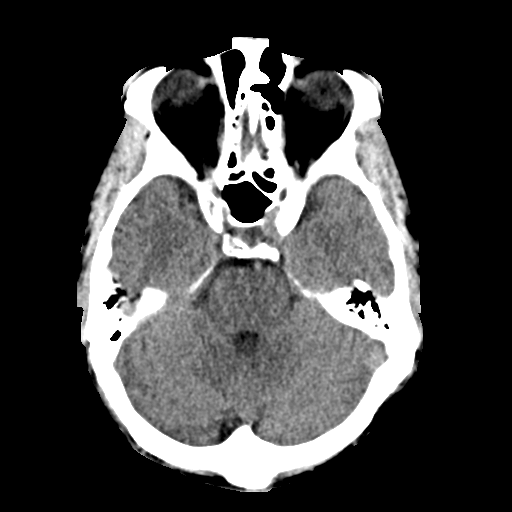
[im 12/34  brain]
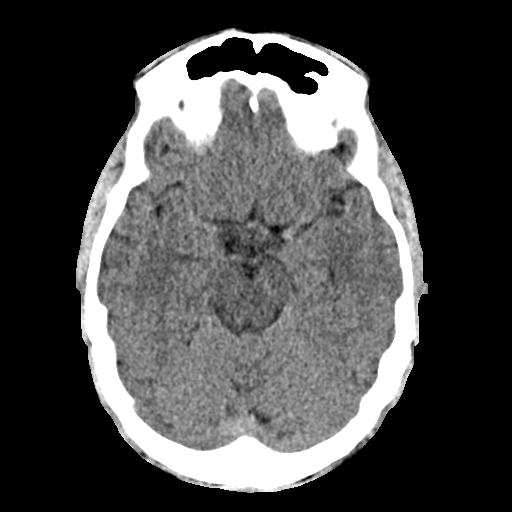
[im 15/34  brain]
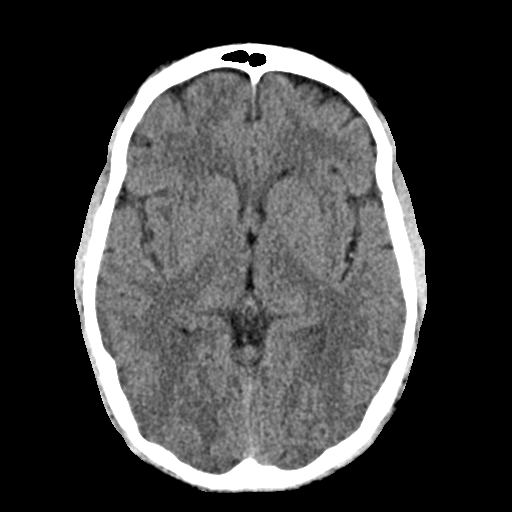
[im 15/34  bone]
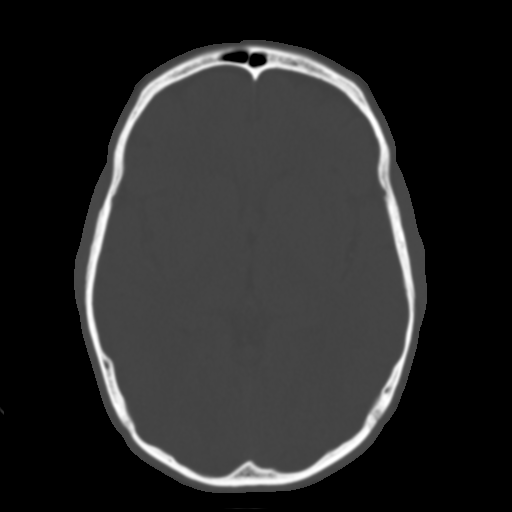
[im 19/34  brain]
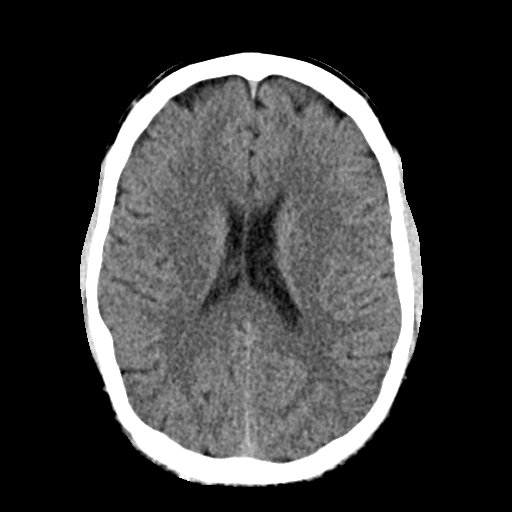
[im 22/34  brain]
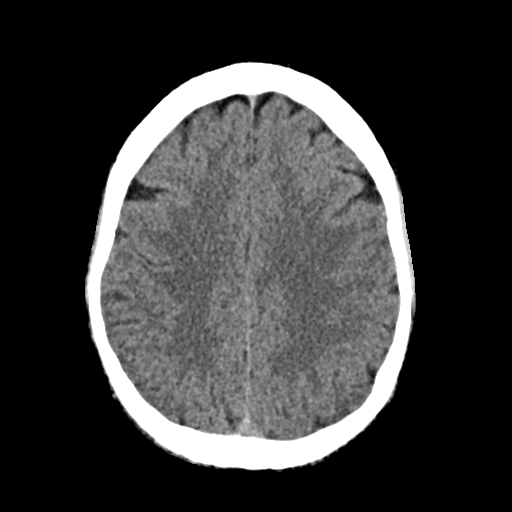
[im 26/34  brain]
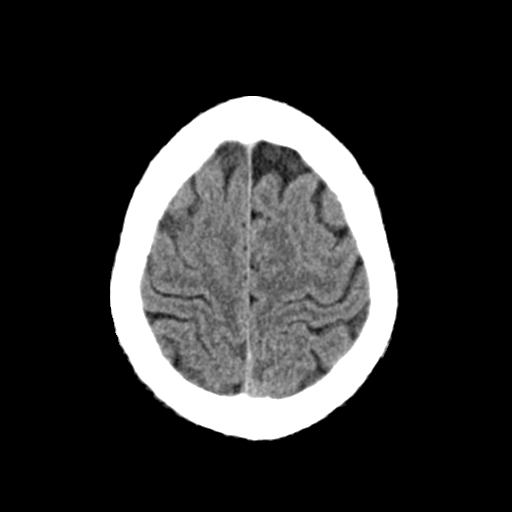
[im 28/34  brain]
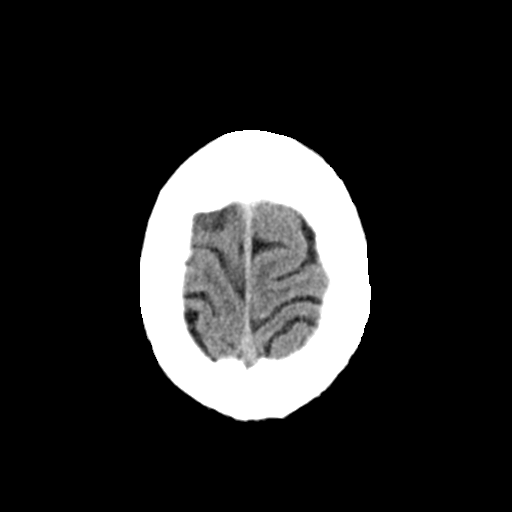
[im 28/34  bone]
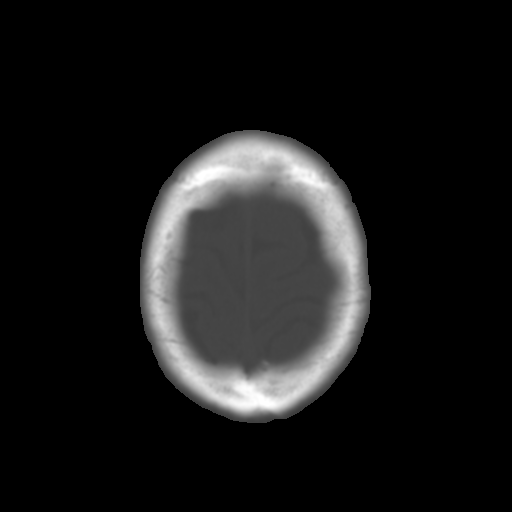
[im 31/34  brain]
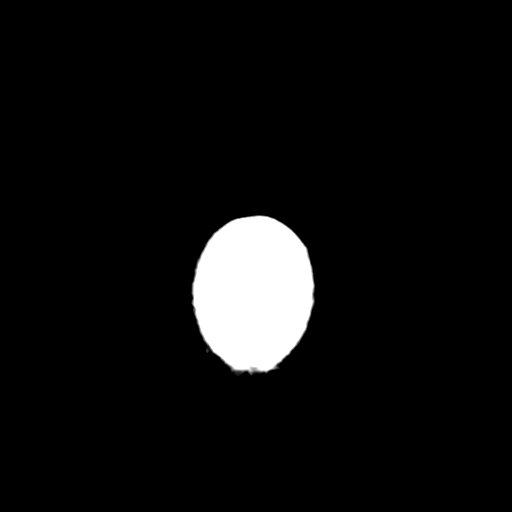

[Series 4: coronal soft · coronal · 0.33mm/px · 3 of 67 slices shown]
[im 23/67  brain]
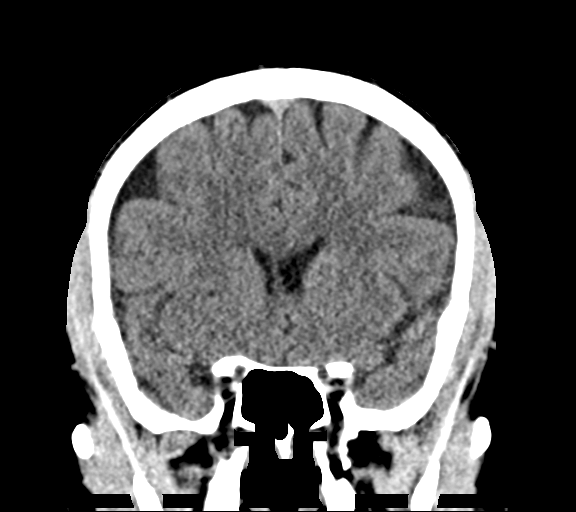
[im 30/67  brain]
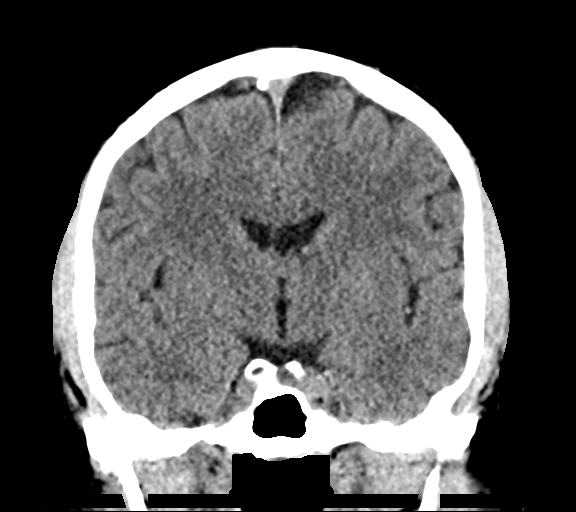
[im 37/67  brain]
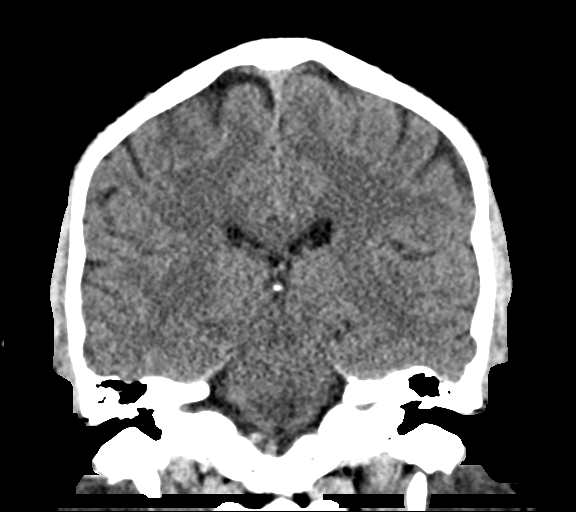

[Series 5: sag soft · sagittal · 0.33mm/px · 3 of 64 slices shown]
[im 22/64  brain]
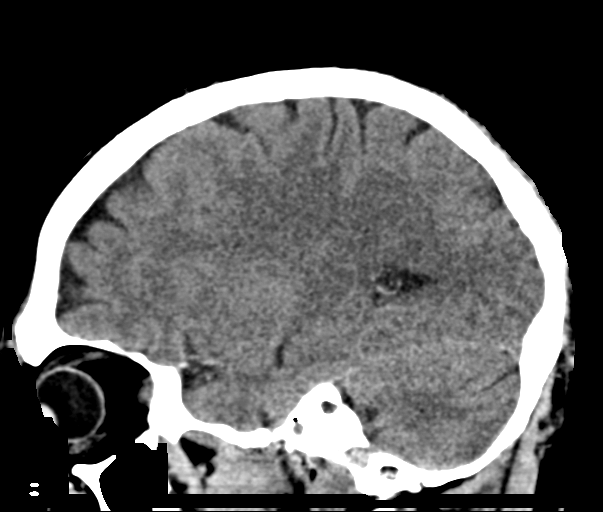
[im 32/64  brain]
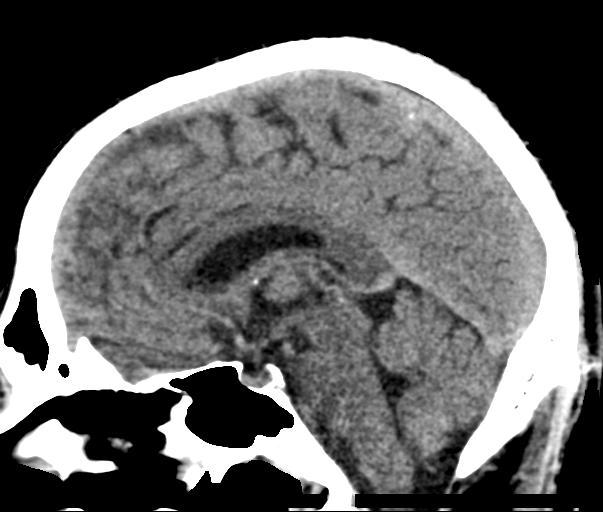
[im 43/64  brain]
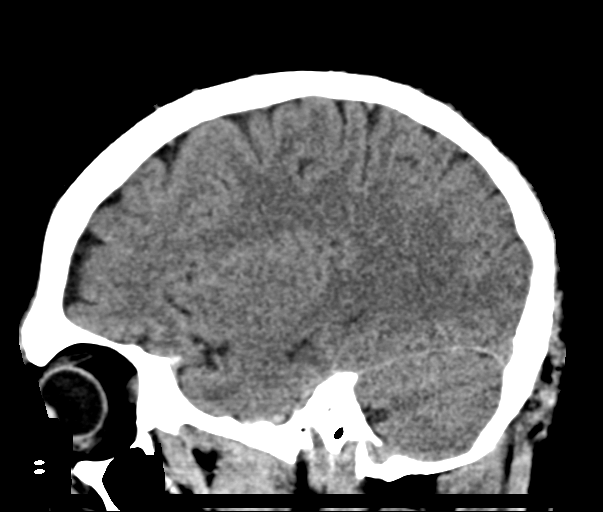

[16 of 47 positions shown; findings below may reference images not displayed]

FINDINGS: Brain: No evidence of acute infarction, hemorrhage, hydrocephalus,
extra-axial collection or mass lesion/mass effect.

Vascular: No hyperdense vessel or unexpected calcification.

Skull: Normal. Negative for fracture or focal lesion.

Sinuses/Orbits: No acute finding.

Other: None.
IMPRESSION: Normal noncontrast head CT.

## 2020-08-17 ENCOUNTER — Other Ambulatory Visit: Payer: Self-pay | Admitting: Medical

## 2020-08-20 ENCOUNTER — Other Ambulatory Visit: Payer: Self-pay | Admitting: Medical

## 2021-02-20 ENCOUNTER — Other Ambulatory Visit: Payer: Self-pay | Admitting: Medical

## 2021-08-30 ENCOUNTER — Other Ambulatory Visit: Payer: Self-pay | Admitting: Medical

## 2021-10-08 DIAGNOSIS — M720 Palmar fascial fibromatosis [Dupuytren]: Secondary | ICD-10-CM

## 2021-10-08 HISTORY — DX: Palmar fascial fibromatosis (dupuytren): M72.0

## 2021-10-13 DIAGNOSIS — R1312 Dysphagia, oropharyngeal phase: Secondary | ICD-10-CM

## 2021-10-13 DIAGNOSIS — I10 Essential (primary) hypertension: Secondary | ICD-10-CM | POA: Insufficient documentation

## 2021-10-13 DIAGNOSIS — M170 Bilateral primary osteoarthritis of knee: Secondary | ICD-10-CM | POA: Insufficient documentation

## 2021-10-13 HISTORY — DX: Essential (primary) hypertension: I10

## 2021-10-13 HISTORY — DX: Bilateral primary osteoarthritis of knee: M17.0

## 2021-10-13 HISTORY — DX: Dysphagia, oropharyngeal phase: R13.12

## 2022-03-27 LAB — HM COLONOSCOPY

## 2022-06-23 ENCOUNTER — Telehealth: Payer: Self-pay | Admitting: Family Medicine

## 2022-06-23 NOTE — Telephone Encounter (Signed)
Patient wants to establish care with you being that his wife is your patient Benjamin Mccullough is that okay?

## 2022-07-27 ENCOUNTER — Encounter: Payer: Self-pay | Admitting: Medical

## 2022-07-27 ENCOUNTER — Encounter: Payer: Self-pay | Admitting: Family Medicine

## 2022-07-27 ENCOUNTER — Ambulatory Visit: Payer: Self-pay | Admitting: Family Medicine

## 2022-07-27 VITALS — BP 145/87 | HR 73 | Ht 74.02 in | Wt 209.5 lb

## 2022-07-27 DIAGNOSIS — Z13220 Encounter for screening for lipoid disorders: Secondary | ICD-10-CM

## 2022-07-27 DIAGNOSIS — I10 Essential (primary) hypertension: Secondary | ICD-10-CM

## 2022-07-27 DIAGNOSIS — C099 Malignant neoplasm of tonsil, unspecified: Secondary | ICD-10-CM

## 2022-07-27 DIAGNOSIS — N400 Enlarged prostate without lower urinary tract symptoms: Secondary | ICD-10-CM

## 2022-07-27 MED ORDER — AMLODIPINE-OLMESARTAN 5-20 MG PO TABS: 1.0000 | ORAL_TABLET | Freq: Every day | ORAL | 1 refills | Status: DC

## 2022-07-27 NOTE — Assessment & Plan Note (Signed)
Status post radiation and chemotherapy.  Continues to have annual CT for surveillance.

## 2022-07-27 NOTE — Progress Notes (Signed)
Benjamin Mccullough - 62 y.o. male MRN [id number]  Date of birth: 07/07/1960  Subjective Chief Complaint  Patient presents with   Establish Care   Hypertension    HPI Benjamin Mccullough is a 62 y.o. male here today for initial visit to establish care.   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    He completed course of radiation and chemotherapy for treatment of his cancer.  He continues to see his radiation oncologist annually and has for follow-up.  He has not had any recurrence of this.  Reports that he feels well.  Blood pressure is treated with Azor.  Doing well at current strength.  His goal is to get off of blood pressure medication.  He leads a very active lifestyle.  He feels like his diet is pretty good for the most part.  He has not had any side effects from medication.  He denies chest pain, shortness of breath, palpitations, headaches or vision changes.   Cholesterol has been borderline in the past.  ROS:  A comprehensive ROS was completed and negative except as noted per HPI  Allergies  Allergen Reactions   Penicillins Other (See Comments)    Childhood    Past Medical History:  Diagnosis Date   Borderline hypertension    Cancer ([place])    Hypertension    Skin cancer     Past Surgical History:  Procedure Laterality Date   ADENOIDECTOMY     KNEE ARTHROSCOPY W/ ACL RECONSTRUCTION      Social History   Socioeconomic History   Marital status: Married    Spouse name: Not on file   Number of children: 2   Years of education: Not on file   Highest education level: Not on file  Occupational History    Comment: Fire department   Occupation: Retired    Comment: Agricultural consultant  Tobacco Use   Smoking status: Never    Passive exposure: Never   Smokeless tobacco: Never  Vaping Use   Vaping Use: Never used  Substance and Sexual Activity   Alcohol use: Yes    Alcohol/week: 1.0 standard drink of alcohol    Types: 1 Standard drinks or  equivalent per week    Comment: Social   Drug use: No   Sexual activity: Yes    Partners: Female  Other Topics Concern   Not on file  Social History Narrative   Not on file   Social Determinants of Health   Financial Resource Strain: Not on file  Food Insecurity: Not on file  Transportation Needs: No Transportation Needs (03/22/2019)   PRAPARE - Hydrologist (Medical): No    Lack of Transportation (Non-Medical): No  Physical Activity: Not on file  Stress: Not on file  Social Connections: Not on file    Family History  Problem Relation Age of Onset   CAD Mother        Stents in her 109s   Heart attack Mother    Hypertension Father    CAD Father        CABG in his 30s   Stroke Father    Prostate cancer Father     Health Maintenance  Topic Date Due   COVID-19 Vaccine (1) 08/12/2022 (Originally 08/08/1965)   INFLUENZA VACCINE  08/30/2022 (Originally 12/30/2021)   Zoster Vaccines- Shingrix (1 of 2) 10/25/2022 (Originally 08/09/1979)   Hepatitis C Screening  07/28/2023 (Originally 08/09/1978)   DTaP/Tdap/Td (2 -  Td or Tdap) 09/15/2031   COLONOSCOPY (Pts 45-52yr Insurance coverage will need to be confirmed)  03/27/2032   HIV Screening  Completed   HPV VACCINES  Aged Out     ----------------------------------------------------------------------------------------------------------------------------------------------------------------------------------------------------------------- Physical Exam BP (!) 145/87 (BP Location: Right Arm, Patient Position: Sitting, Cuff Size: Large)   Pulse 73   Ht 6' 2.02" (1.88 m)   Wt 209 lb 8 oz (95 kg)   SpO2 100%   BMI 26.89 kg/m   Physical Exam Constitutional:      Appearance: Normal appearance.  HENT:     Head: Normocephalic and atraumatic.  Eyes:     General: No scleral icterus. Cardiovascular:     Rate and Rhythm: Normal rate and regular rhythm.  Pulmonary:     Effort: Pulmonary effort is normal.      Breath sounds: Normal breath sounds.  Musculoskeletal:     Cervical back: Neck supple.  Neurological:     Mental Status: He is alert.  Psychiatric:        Mood and Affect: Mood normal.        Behavior: Behavior normal.     ------------------------------------------------------------------------------------------------------------------------------------------------------------------------------------------------------------------- Assessment and Plan  Primary hypertension Blood pressure is mildly elevated today in the clinic.  Readings at home have been within normal limits.  We discussed that he can consider adding magnesium supplement and/or beet root extract to help with blood pressure support and additionally he is already taking.  Low-sodium diet encouraged.  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  Continues to have annual CT for surveillance.   Meds ordered this encounter  Medications   amLODipine-olmesartan (AZOR) 5-20 MG tablet    Sig: Take 1 tablet by mouth daily.    Dispense:  90 tablet    Refill:  1    No follow-ups on file.    This visit occurred during the SARS-CoV-2 public health emergency.  Safety protocols were in place, including screening questions prior to the visit, additional usage of staff PPE, and extensive cleaning of exam room while observing appropriate contact time as indicated for disinfecting solutions.

## 2022-07-27 NOTE — Assessment & Plan Note (Signed)
Blood pressure is mildly elevated today in the clinic.  Readings at home have been within normal limits.  We discussed that he can consider adding magnesium supplement and/or beet root extract to help with blood pressure support and additionally he is already taking.  Low-sodium diet encouraged.

## 2022-07-27 NOTE — Patient Instructions (Addendum)
Great to meet you! Let's continue the amlodipine/olmesartan for now.   If your magnesium levels aren't too high you can add an OTC supplement of magnesium glycinate '250mg'$ -'500mg'$  for blood pressure support.  Beet root extract may also be helpful for blood pressure.

## 2022-07-28 LAB — PSA: PSA: 3.36 ng/mL (ref <=4.00)

## 2022-07-28 LAB — COMPLETE METABOLIC PANEL WITH GFR
AG Ratio: 2 (calc) (ref 1.0–2.5)
ALT: 18 U/L (ref 9–46)
AST: 20 U/L (ref 10–35)
Albumin: 4.8 g/dL (ref 3.6–5.1)
Alkaline phosphatase (APISO): 57 U/L (ref 35–144)
BUN: 21 mg/dL (ref 7–25)
CO2: 29 mmol/L (ref 20–32)
Calcium: 9.5 mg/dL (ref 8.6–10.3)
Chloride: 107 mmol/L (ref 98–110)
Creat: 1.08 mg/dL (ref 0.70–1.35)
Globulin: 2.4 g/dL (calc) (ref 1.9–3.7)
Glucose, Bld: 99 mg/dL (ref 65–99)
Potassium: 4.3 mmol/L (ref 3.5–5.3)
Sodium: 143 mmol/L (ref 135–146)
Total Bilirubin: 0.6 mg/dL (ref 0.2–1.2)
Total Protein: 7.2 g/dL (ref 6.1–8.1)
eGFR: 78 mL/min/{1.73_m2} (ref >=60)

## 2022-07-28 LAB — CBC WITH DIFFERENTIAL/PLATELET
Absolute Monocytes: 182 cells/uL — ABNORMAL LOW (ref 200–950)
Basophils Absolute: 11 cells/uL (ref 0–200)
Basophils Relative: 0.4 %
Eosinophils Absolute: 31 cells/uL (ref 15–500)
Eosinophils Relative: 1.1 %
HCT: 40.5 % (ref 38.5–50.0)
Hemoglobin: 14.1 g/dL (ref 13.2–17.1)
Lymphs Abs: 566 cells/uL — ABNORMAL LOW (ref 850–3900)
MCH: 30.9 pg (ref 27.0–33.0)
MCHC: 34.8 g/dL (ref 32.0–36.0)
MCV: 88.8 fL (ref 80.0–100.0)
MPV: 10.5 fL (ref 7.5–12.5)
Monocytes Relative: 6.5 %
Neutro Abs: 2010 cells/uL (ref 1500–7800)
Neutrophils Relative %: 71.8 %
Platelets: 175 10*3/uL (ref 140–400)
RBC: 4.56 10*6/uL (ref 4.20–5.80)
RDW: 13 % (ref 11.0–15.0)
Total Lymphocyte: 20.2 %
WBC: 2.8 10*3/uL — ABNORMAL LOW (ref 3.8–10.8)

## 2022-07-28 LAB — LIPID PANEL W/REFLEX DIRECT LDL
Cholesterol: 192 mg/dL (ref <200)
HDL: 56 mg/dL (ref >=40)
LDL Cholesterol (Calc): 119 mg/dL (calc) — ABNORMAL HIGH
Non-HDL Cholesterol (Calc): 136 mg/dL (calc) — ABNORMAL HIGH (ref <130)
Total CHOL/HDL Ratio: 3.4 (calc) (ref <5.0)
Triglycerides: 73 mg/dL (ref <150)

## 2022-07-28 LAB — MAGNESIUM: Magnesium: 2 mg/dL (ref 1.5–2.5)

## 2022-07-28 LAB — TSH: TSH: 4.44 mIU/L (ref 0.40–4.50)

## 2022-07-28 NOTE — Addendum Note (Signed)
Addended by: Narda Rutherford on: 07/28/2022 07:58 AM   Modules accepted: Orders

## 2023-01-15 ENCOUNTER — Other Ambulatory Visit: Payer: Self-pay | Admitting: Family Medicine

## 2023-02-02 ENCOUNTER — Telehealth: Payer: Self-pay | Admitting: Family Medicine

## 2023-02-02 NOTE — Telephone Encounter (Signed)
Patient wants a full blood panel lab and a CAC before his appointment 9/5.

## 2023-02-04 ENCOUNTER — Ambulatory Visit (INDEPENDENT_AMBULATORY_CARE_PROVIDER_SITE_OTHER): Payer: 59 | Admitting: Family Medicine

## 2023-02-04 ENCOUNTER — Encounter: Payer: Self-pay | Admitting: Family Medicine

## 2023-02-04 VITALS — BP 149/96 | HR 87 | Ht 74.02 in | Wt 206.0 lb

## 2023-02-04 DIAGNOSIS — I10 Essential (primary) hypertension: Secondary | ICD-10-CM

## 2023-02-04 DIAGNOSIS — I712 Thoracic aortic aneurysm, without rupture, unspecified: Secondary | ICD-10-CM | POA: Diagnosis not present

## 2023-02-04 DIAGNOSIS — Z23 Encounter for immunization: Secondary | ICD-10-CM

## 2023-02-04 HISTORY — DX: Thoracic aortic aneurysm, without rupture, unspecified: I71.20

## 2023-02-04 NOTE — Assessment & Plan Note (Signed)
Blood pressure is elevated in clinic however his readings at home have been much better controlled.  Will plan to continue current medications.

## 2023-02-04 NOTE — Assessment & Plan Note (Signed)
Noted on recent CT scan.  Has had evaluation by cardiothoracic surgery.  Recommend continued monitoring annually.

## 2023-02-04 NOTE — Progress Notes (Signed)
Benjamin Mccullough - 62 y.o. male MRN [id number]  Date of birth: [birth year]  Subjective No chief complaint on file.   HPI Benjamin Mccullough is a 62 year old male here today for follow-up visit.    History of hypertension.  Blood pressure elevated today in clinic however he is checking readings at home regularly and consistently getting readings of 115-120/65-75.  He denies any symptoms of hypertension including chest pain, shortness of breath, palpitations, headaches or vision changes.  Tolerating amlodipine/olmesartan well at current strength.  He did have a CT scan for follow-up of head neck cancer which noted a thoracic aneurysm.  He did have consultation with cardiothoracic surgeon and I will continue to monitor this on his serial CT scans.  He was interested in having coronary calcium scoring as well.  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  Tobacco Use   Smoking status: Never    Passive exposure: Never   Smokeless tobacco: Never  Vaping Use   Vaping status: Never Used  Substance and Sexual Activity   Alcohol use: Yes    Alcohol/week: 1.0 standard drink of alcohol    Types: 1 Standard drinks or equivalent per week    Comment: Social   Drug use: No   Sexual activity: Yes    Partners: Female  Other Topics Concern   Not on file  Social History Narrative   Not on file   Social Determinants of Health   Financial Resource  Strain: Not on file  Food Insecurity: Not on file  Transportation Needs: No Transportation Needs (03/22/2019)   PRAPARE - Administrator, Civil Service (Medical): No    Lack of Transportation (Non-Medical): No  Physical Activity: Not on file  Stress: Not on file  Social Connections: Not on file    Family History  Problem Relation Age of Onset   CAD Mother        Stents in her 54s   Heart attack Mother    Hypertension Father    CAD Father        CABG in his 23s   Stroke Father    Prostate cancer Father     Health Maintenance  Topic Date Due   COVID-19 Vaccine (1) 04/22/2023 (Originally 08[birth year]08/1965)   Zoster Vaccines- Shingrix (1 of 2) 05/06/2023 (Originally 08/09/1979)   Hepatitis C Screening  07/28/2023 (Originally 08/09/1978)   DTaP/Tdap/Td (2 - Td or Tdap) 09/15/2031   Colonoscopy  03/27/2032   INFLUENZA VACCINE  Completed   HIV Screening  Completed   HPV VACCINES  Aged Out     ----------------------------------------------------------------------------------------------------------------------------------------------------------------------------------------------------------------- Physical Exam BP (!) 149/96 (BP Location: Left Arm, Patient Position: Sitting, Cuff Size: Large)   Pulse 87   Ht 6' 2.02" (1.88 m)   Wt 206 lb (93.4 kg)   SpO2  98%   BMI 26.43 kg/m   Physical Exam Constitutional:      Appearance: Normal appearance.  HENT:     Head: Normocephalic and atraumatic.  Eyes:     General: No scleral icterus. Cardiovascular:     Rate and Rhythm: Normal rate and regular rhythm.  Pulmonary:     Effort: Pulmonary effort is normal.     Breath sounds: Normal breath sounds.  Musculoskeletal:     Cervical back: Neck supple.  Neurological:     Mental Status: He is alert.  Psychiatric:        Mood and Affect: Mood normal.        Behavior: Behavior normal.      ------------------------------------------------------------------------------------------------------------------------------------------------------------------------------------------------------------------- Assessment and Plan  Primary hypertension Blood pressure is elevated in clinic however his readings at home have been much better controlled.  Will plan to continue current medications.  Thoracic aortic aneurysm (TAA) (HCC) Noted on recent CT scan.  Has had evaluation by cardiothoracic surgery.  Recommend continued monitoring annually.   No orders of the defined types were placed in this encounter.   Return in about 6 months (around 08/04/2023) for HTN/Fasting labs.    This visit occurred during the SARS-CoV-2 public health emergency.  Safety protocols were in place, including screening questions prior to the visit, additional usage of staff PPE, and extensive cleaning of exam room while observing appropriate contact time as indicated for disinfecting solutions.

## 2023-02-08 ENCOUNTER — Other Ambulatory Visit: Payer: Self-pay | Admitting: Family Medicine

## 2023-02-16 ENCOUNTER — Ambulatory Visit (INDEPENDENT_AMBULATORY_CARE_PROVIDER_SITE_OTHER): Payer: Self-pay

## 2023-02-16 DIAGNOSIS — I10 Essential (primary) hypertension: Secondary | ICD-10-CM

## 2023-02-17 ENCOUNTER — Other Ambulatory Visit: Payer: Self-pay | Admitting: Family Medicine

## 2023-02-17 DIAGNOSIS — R931 Abnormal findings on diagnostic imaging of heart and coronary circulation: Secondary | ICD-10-CM

## 2023-02-17 MED ORDER — ROSUVASTATIN CALCIUM 20 MG PO TABS: 20.0000 mg | ORAL_TABLET | Freq: Every day | ORAL | 3 refills | Status: DC

## 2023-02-22 ENCOUNTER — Telehealth: Payer: Self-pay | Admitting: Family Medicine

## 2023-02-22 NOTE — Telephone Encounter (Signed)
Lifestyle changes are more likely to contribute to low BP than the rosuvastatin.  Recommend cutting BP medication in 1/2.

## 2023-02-22 NOTE — Telephone Encounter (Signed)
Pt called.  Since taking Rosouvastatin he has been experiencing low blood pressure, 75/50 dizziness,  almost passed out when he was mowing his yard.  He takes the Rosouvastatin in the morning with his bp meds. This morning he did not take the Rosouvastatin with his bp meds and his bp was more on the side of normal. He has also changed his diet and been eating pretty clean and wonders if all of this at one time is the problem?Please advise.

## 2023-02-23 ENCOUNTER — Ambulatory Visit (INDEPENDENT_AMBULATORY_CARE_PROVIDER_SITE_OTHER): Payer: 59

## 2023-02-23 VITALS — BP 122/79 | HR 84 | Ht 74.02 in | Wt 205.0 lb

## 2023-02-23 DIAGNOSIS — I10 Essential (primary) hypertension: Secondary | ICD-10-CM

## 2023-02-23 NOTE — Progress Notes (Signed)
Established Patient Office Visit  Subjective   Patient ID: Benjamin Mccullough, male    DOB: [birth year]  Age: 62 y.o. MRN: [id number]  Chief Complaint  Patient presents with   Hypertension    B/p check nurse visit    HPI  Patient is here for B/P check. Denies trouble sleeping, Palpitations.   ROS    Objective:     BP 122/79   Pulse 84   Ht 6' 2.02" (1.88 m)   Wt 205 lb 0.6 oz (93 kg)   SpO2 100%   BMI 26.31 kg/m    Physical Exam   No results found for any visits on 02/23/23.    The 10-year ASCVD risk score (Arnett DK, et al., 2019) is: 9.8%    Assessment & Plan:  Patient advised to cut B/P medication in half. See PCP in 3 months. Problem List Items Addressed This Visit   None   Return if symptoms worsen or fail to improve.    Paylin Hailu  [place], CMA

## 2023-02-23 NOTE — Patient Instructions (Signed)
Patient advised to cut B/P medication in half. See PCP in 3 months

## 2023-02-24 NOTE — Telephone Encounter (Signed)
Contacted pt and he was already given the below instructions.

## 2023-02-25 NOTE — Addendum Note (Signed)
Addended by: Lamonte Sakai, Miklos Bidinger D on: 02/25/2023 01:13 PM   Modules accepted: Orders

## 2023-03-08 ENCOUNTER — Telehealth: Payer: Self-pay | Admitting: Family Medicine

## 2023-03-08 NOTE — Telephone Encounter (Signed)
Patient called in stating that he feels over medicated with his new diet and medication, also is still getting low BP (95/52) and dizzy. Please advise.

## 2023-03-10 NOTE — Telephone Encounter (Signed)
Pt has been advised to stop BP medication. He will keep a log and advise if BP elevates above 140/90

## 2023-03-10 NOTE — Telephone Encounter (Signed)
Please have him d/c BP medication.  Stay well hydrated.   CM

## 2023-04-01 ENCOUNTER — Ambulatory Visit: Payer: 59

## 2023-04-01 VITALS — BP 148/102 | HR 104 | Ht 72.2 in | Wt 197.8 lb

## 2023-04-01 DIAGNOSIS — I2510 Atherosclerotic heart disease of native coronary artery without angina pectoris: Secondary | ICD-10-CM | POA: Insufficient documentation

## 2023-04-01 DIAGNOSIS — E785 Hyperlipidemia, unspecified: Secondary | ICD-10-CM | POA: Insufficient documentation

## 2023-04-01 DIAGNOSIS — I10 Essential (primary) hypertension: Secondary | ICD-10-CM | POA: Diagnosis not present

## 2023-04-01 DIAGNOSIS — I491 Atrial premature depolarization: Secondary | ICD-10-CM | POA: Insufficient documentation

## 2023-04-01 DIAGNOSIS — I712 Thoracic aortic aneurysm, without rupture, unspecified: Secondary | ICD-10-CM | POA: Diagnosis not present

## 2023-04-01 HISTORY — DX: Atrial premature depolarization: I49.1

## 2023-04-01 HISTORY — DX: Hyperlipidemia, unspecified: E78.5

## 2023-04-01 HISTORY — DX: Atherosclerotic heart disease of native coronary artery without angina pectoris: I25.10

## 2023-04-01 MED ORDER — METOPROLOL TARTRATE 50 MG PO TABS: ORAL_TABLET | ORAL | 0 refills | Status: DC

## 2023-04-01 NOTE — Patient Instructions (Addendum)
Medication Instructions:  Your physician recommends that you continue on your current medications as directed. Please refer to the Current Medication list given to you today.  *If you need a refill on your cardiac medications before your next appointment, please call your pharmacy*   Lab Work: BMET- Today   If you have labs (blood work) drawn today and your tests are completely normal, you will receive your results only by: MyChart Message (if you have MyChart) OR A paper copy in the mail If you have any lab test that is abnormal or we need to change your treatment, we will call you to review the results.   Testing/Procedures: Your physician has requested that you have an echocardiogram. Echocardiography is a painless test that uses sound waves to create images of your heart. It provides your doctor with information about the size and shape of your heart and how well your heart's chambers and valves are working. This procedure takes approximately one hour. There are no restrictions for this procedure. Please do NOT wear cologne, perfume, aftershave, or lotions (deodorant is allowed). Please arrive 15 minutes prior to your appointment time.   Your physician has requested for you to have a coronary CT. Please refer to instructions given   Follow-Up: At [facility], you and your health needs are our priority.  As part of our continuing mission to provide you with exceptional heart care, we have created designated Provider Care Teams.  These Care Teams include your primary Cardiologist (physician) and Advanced Practice Providers (APPs -  Physician Assistants and Nurse Practitioners) who all work together to provide you with the care you need, when you need it.  We recommend signing up for the patient portal called "MyChart".  Sign up information is provided on this After Visit Summary.  MyChart is used to connect with patients for Virtual Visits (Telemedicine).  Patients are able to  view lab/test results, encounter notes, upcoming appointments, etc.  Non-urgent messages can be sent to your provider as well.   To learn more about what you can do with MyChart, go to [email or web address].    Your next appointment:   6 week(s)  Provider:   Huntley Dec, MD    Other Instructions   Your cardiac CT will be scheduled at one of the below locations:   [facility] [address] [place], [place] [address] [phone]   If scheduled at [facility], please arrive at the [facility] (Entrance C2) of [facility] 30 minutes prior to test start time. You can use the FREE valet parking offered at entrance C (encouraged to control the heart rate for the test)  Proceed to the [facility] Radiology Department (first floor) to check-in and test prep.  All radiology patients and guests should use entrance C2 at [facility], accessed from [facility], even though the hospital's physical address listed is [address]       Please follow these instructions carefully (unless otherwise directed):  An IV will be required for this test and Nitroglycerin will be given.  Hold all erectile dysfunction medications at least 3 days (72 hrs) prior to test. (Ie viagra, cialis, sildenafil, tadalafil, etc)   On the Night Before the Test: Be sure to Drink plenty of water. Do not consume any caffeinated/decaffeinated beverages or chocolate 12 hours prior to your test. Do not take any antihistamines 12 hours prior to your test.   On the Day of  the Test: Drink plenty of water until 1 hour prior to the test. Do not eat any food 1 hour prior to test. You may take your regular medications prior to the test.  Take metoprolol (Lopressor) two hours prior to test.       After the Test: Drink plenty of water. After receiving IV contrast, you may experience a mild flushed feeling. This is normal. On  occasion, you may experience a mild rash up to 24 hours after the test. This is not dangerous. If this occurs, you can take Benadryl 25 mg and increase your fluid intake. If you experience trouble breathing, this can be serious. If it is severe call 911 IMMEDIATELY. If it is mild, please call our office. If you take any of these medications: Glipizide/Metformin, Avandament, Glucavance, please do not take 48 hours after completing test unless otherwise instructed.  We will call to schedule your test 2-4 weeks out understanding that some insurance companies will need an authorization prior to the service being performed.   For more information and frequently asked questions, please visit our website : [email or web address]  For non-scheduling related questions, please contact the cardiac imaging nurse navigator should you have any questions/concerns: Cardiac Imaging Nurse Navigators Direct Office Dial: [phone]   For scheduling needs, including cancellations and rescheduling, please call [place], [phone].

## 2023-04-01 NOTE — Assessment & Plan Note (Addendum)
With blood pressure now trending back up with amlodipine/olmesartan discontinued about a week ago, advised him to keep a log and his blood pressure readings consistently above 130 over 80 mmHg, should be back on antihypertensive medications.  Would recommend starting olmesartan 20 mg once daily in that setting. He will keep a log and follow-up with either [place] or his PCP regarding this.  Will obtain a baseline transthoracic echocardiogram for cardiac structure and function assessment including the valves.

## 2023-04-01 NOTE — Assessment & Plan Note (Signed)
Frequent ectopic atrial beats on EKG, asymptomatic. Likely benign. If he does have any symptoms, will consider heart monitor in future.

## 2023-04-01 NOTE — Assessment & Plan Note (Signed)
Discussed extensively about the findings of the coronary atherosclerosis and potential causes for plaque buildup.  He remains asymptomatic. Role of diet, lifestyle, genetics and this was discussed extensively.  Given his severe plaque buildup, further evaluation for burden of atherosclerosis and if any obstructive lesions are present was discussed.  In the setting we will proceed with CTA coronary angiogram, use of IV dye reviewed. Single dose metoprolol tartrate 50 mg on the morning of the test to optimize heart rates for imaging.

## 2023-04-01 NOTE — Progress Notes (Signed)
Cardiology Consultation:    Date:  04/01/2023   ID:  Benjamin Mccullough, DOB 12/20/1960, MRN [id number]  PCP:  Everrett Coombe, DO  Cardiologist:  Marlyn Corporal Savannah Morford, MD   Referring MD: Everrett Coombe, DO   No chief complaint on file.    ASSESSMENT AND PLAN:   62 year old male patient with history of throat cancer s/p chemoradiation therapy completed in 2020, hypertension, hyperlipidemia, mild resting aortic aneurysm measuring 4.3 cm, coronary atherosclerosis here for further evaluation  Problem List Items Addressed This Visit     Primary hypertension    With blood pressure now trending back up with amlodipine/olmesartan discontinued about a week ago, advised him to keep a log and his blood pressure readings consistently above 130 over 80 mmHg, should be back on antihypertensive medications.  Would recommend starting olmesartan 20 mg once daily in that setting. He will keep a log and follow-up with either [place] or his PCP regarding this.  Will obtain a baseline transthoracic echocardiogram for cardiac structure and function assessment including the valves.      Relevant Medications   metoprolol tartrate (LOPRESSOR) 50 MG tablet   Other Relevant Orders   EKG 12-Lead (Completed)   Basic metabolic panel   Thoracic aortic aneurysm (TAA) (HCC) - Primary    Had visit with CT surgeon. Has annual follow-ups scheduled.  Aware of avoiding to left heavy weights. Low weights with high repetition is advised.      Relevant Medications   metoprolol tartrate (LOPRESSOR) 50 MG tablet   Other Relevant Orders   EKG 12-Lead (Completed)   Coronary atherosclerosis, calcium scoring 02-16-2023 with 1101 (LAD 875, LCx 85, RCA 141]    Discussed extensively about the findings of the coronary atherosclerosis and potential causes for plaque buildup.  He remains asymptomatic. Role of diet, lifestyle, genetics and this was discussed extensively.  Given his severe plaque buildup, further evaluation  for burden of atherosclerosis and if any obstructive lesions are present was discussed.  In the setting we will proceed with CTA coronary angiogram, use of IV dye reviewed. Single dose metoprolol tartrate 50 mg on the morning of the test to optimize heart rates for imaging.       Relevant Medications   metoprolol tartrate (LOPRESSOR) 50 MG tablet   Other Relevant Orders   EKG 12-Lead (Completed)   CT CORONARY MORPH W/CTA COR W/SCORE W/CA W/CM &/OR WO/CM   ECHOCARDIOGRAM COMPLETE   Dyslipidemia    The 10-year ASCVD risk score (Arnett DK, et al., 2019) is: 13.5%   Values used to calculate the score:     Age: 69 years     Sex: Male     Is Non-Hispanic African American: No     Diabetic: No     Tobacco smoker: No     Systolic Blood Pressure: 148 mmHg     Is BP treated: Yes     HDL Cholesterol: 56 mg/dL     Total Cholesterol: 192 mg/dL   He does have significantly elevated cardiovascular risk now in addition with coronary atherosclerosis his overall cardiovascular disease risk is high.  Reviewed potential causes for dyslipidemia. Reviewed rationale to suppress LDL levels to below 70 mg/dL and if possible further below 55 mg/dL.  He started taking rosuvastatin 20 mg for the past month and a half and has been tolerating it reasonably well.  Reviewed potential side effects with rosuvastatin including myopathies muscle aches, potential risk for advancing to diabetes at early pace, in people with predisposition  for diabetes.  Alternatives nonpharmacological briefly reviewed but efficacy unclear has been restated.       Ectopic atrial beats    Frequent ectopic atrial beats on EKG, asymptomatic. Likely benign. If he does have any symptoms, will consider heart monitor in future.       Relevant Medications   metoprolol tartrate (LOPRESSOR) 50 MG tablet      History of Present Illness:    Benjamin Mccullough is a 62 y.o. male who is being seen today for the evaluation of coronary  atherosclerosis at the request of Everrett Coombe, DO.  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 Has history of throat cancer s/p chemoradiation therapy completed in 2020 and continues to be on surveillance, hypertension, hyperlipidemia, mild aortic aneurysm measuring 4.3 cm, coronary atherosclerosis. CT coronary calcium score imaging from 02-16-2023 reported elevated calcium score of 1101 [left main 0, LAD 875, LCx 85, RCA 141], mildly dilated ascending aorta 4.3 cm.  Reports excellent functional status at baseline.  Has been working aggressively at reducing his overall cardiovascular risk.  Exercising regularly.  Over the past few months he is altered his diet significantly.  Lost 10 to 14 pounds in the last couple months.  Blood pressure controlled at home mentions typically low blood pressure readings with systolic down to 53G at times with exercise associated with symptoms of lightheadedness at times.  Recently he cut back on his long-term blood pressure medication amlodipine/olmesartan and entirely discontinued about a week ago.  He attributes some elevated blood pressure readings at home to whitecoat hypertension.  Over the past day or 2 he had blood pressure readings with systolic up to 140s at home. Blood pressure today here elevated 148/102 mmHg.  Remote history of treadmill exercise stress test from November 2015, excellent capacity with 14 minutes of exercise, attaining 17.2 METS, no EKG changes suggestive of ischemia.  Resting hypertension with appropriate blood pressure response was reported.  Last echocardiogram available is from 2015 November EF 55 to 60%, mitral annular calcification reported.  Last blood work available to review is from 07-27-2022 Magnesium 2 BUN 21, creatinine 1.08, EGFR 78 Sodium 143, potassium 4.3 Normal transaminases and alkaline phosphatase WBC 2.8, hemoglobin 14.1, hematocrit 40.5, platelets 175 Lipid panel total cholesterol 192, HDL 56,  triglycerides 73, LDL 119, non-HDL 136, suboptimal. Last hemoglobin A1c from June 2023 was borderline 5.5.  Does not smoke. Rare social alcohol consumption such as wine tasting's. No recreational drug use.  EKG in the clinic today shows sinus rhythm heart rate 83/min, frequent atrial ectopic beats noted.  QRS duration 104 ms, incomplete RBBB morphology. In comparison prior EKG from October 2020 showed sinus rhythm with incomplete RBBB QRS duration 140 ms.    Past Medical History:  Diagnosis Date   Borderline hypertension    Cancer (HCC)    Hypertension    Skin cancer     Past Surgical History:  Procedure Laterality Date   ADENOIDECTOMY     KNEE ARTHROSCOPY W/ ACL RECONSTRUCTION      Current Medications: Current Meds  Medication Sig   metoprolol tartrate (LOPRESSOR) 50 MG tablet Take 1 tablet by mouth 2 hours before your CT scan   rosuvastatin (CRESTOR) 20 MG tablet Take 1 tablet (20 mg total) by mouth daily.     Allergies:   Penicillins   Social History   Socioeconomic History   Marital status: Married    Spouse name: Not on file   Number of children: 2   Years of  education: Not on file   Highest education level: Not on file  Occupational History    Comment: Fire department   Occupation: Retired    Comment: Fireman  Tobacco Use   Smoking status: Never    Passive exposure: Never   Smokeless tobacco: Never  Vaping Use   Vaping status: Never Used  Substance and Sexual Activity   Alcohol use: Yes    Alcohol/week: 1.0 standard drink of alcohol    Types: 1 Standard drinks or equivalent per week    Comment: Social   Drug use: No   Sexual activity: Yes    Partners: Female  Other Topics Concern   Not on file  Social History Narrative   Not on file   Social Determinants of Health   Financial Resource Strain: Not on file  Food Insecurity: Not on file  Transportation Needs: No Transportation Needs (03/22/2019)   PRAPARE - Scientist, research (physical sciences) (Medical): No    Lack of Transportation (Non-Medical): No  Physical Activity: Not on file  Stress: Not on file  Social Connections: Not on file     Family History: The patient's family history includes CAD in his father and mother; Heart attack in his mother; Hypertension in his father; Prostate cancer in his father; Stroke in his father. ROS:   Please see the history of present illness.    All 14 point review of systems negative except as described per history of present illness.  EKGs/Labs/Other Studies Reviewed:    The following studies were reviewed today:   EKG:  EKG Interpretation Date/Time:  Thursday April 01 2023 09:02:14 EDT Ventricular Rate:  83 PR Interval:  172 QRS Duration:  104 QT Interval:  408 QTC Calculation: 479 R Axis:   86  Text Interpretation: Sinus rhythm with Premature atrial complexes Incomplete right bundle branch block When compared with ECG of 28-Mar-2019 17:10, Premature atrial complexes are now Present Confirmed by Huntley Dec reddy [phone]) on 04/01/2023 9:10:07 AM    Recent Labs: 07/27/2022: ALT 18; BUN 21; Creat 1.08; Hemoglobin 14.1; Magnesium 2.0; Platelets 175; Potassium 4.3; Sodium 143; TSH 4.44  Recent Lipid Panel    Component Value Date/Time   CHOL 192 07/27/2022 1002   TRIG 73 07/27/2022 1002   HDL 56 07/27/2022 1002   CHOLHDL 3.4 07/27/2022 1002   VLDL 12.8 10/04/2019 0902   LDLCALC 119 (H) 07/27/2022 1002    Physical Exam:    VS:  BP (!) 148/102   Pulse (!) 104   Ht 6' 0.2" (1.834 m)   Wt 197 lb 12.8 oz (89.7 kg)   SpO2 97%   BMI 26.68 kg/m     Wt Readings from Last 3 Encounters:  04/01/23 197 lb 12.8 oz (89.7 kg)  02/23/23 205 lb 0.6 oz (93 kg)  02/04/23 206 lb (93.4 kg)     GENERAL:  Well nourished, well developed in no acute distress NECK: No JVD; No carotid bruits CARDIAC: RRR, S1 and S2 present, no murmurs, no rubs, no gallops.  Occasional extra beat audible. CHEST:  Clear to auscultation  without rales, wheezing or rhonchi  Extremities: No pitting pedal edema. Pulses bilaterally symmetric with radial 2+ and dorsalis pedis 2+ NEUROLOGIC:  Alert and oriented x 3  Medication Adjustments/Labs and Tests Ordered: Current medicines are reviewed at length with the patient today.  Concerns regarding medicines are outlined above.  Orders Placed This Encounter  Procedures   CT CORONARY MORPH W/CTA COR W/SCORE W/CA W/CM &/  OR WO/CM   Basic metabolic panel   EKG 12-Lead   ECHOCARDIOGRAM COMPLETE   Meds ordered this encounter  Medications   metoprolol tartrate (LOPRESSOR) 50 MG tablet    Sig: Take 1 tablet by mouth 2 hours before your CT scan    Dispense:  1 tablet    Refill:  0    Signed, Catalia Massett reddy Macie Baum, MD, MPH, [facility]. 04/01/2023 9:12 AM    [facility]

## 2023-04-01 NOTE — Assessment & Plan Note (Addendum)
Had visit with CT surgeon. Has annual follow-ups scheduled.  Aware of avoiding to left heavy weights. Low weights with high repetition is advised.

## 2023-04-01 NOTE — Assessment & Plan Note (Addendum)
The 10-year ASCVD risk score (Arnett DK, et al., 2019) is: 13.5%   Values used to calculate the score:     Age: 62 years     Sex: Male     Is Non-Hispanic African American: No     Diabetic: No     Tobacco smoker: No     Systolic Blood Pressure: 148 mmHg     Is BP treated: Yes     HDL Cholesterol: 56 mg/dL     Total Cholesterol: 192 mg/dL   He does have significantly elevated cardiovascular risk now in addition with coronary atherosclerosis his overall cardiovascular disease risk is high.  Reviewed potential causes for dyslipidemia. Reviewed rationale to suppress LDL levels to below 70 mg/dL and if possible further below 55 mg/dL.  He started taking rosuvastatin 20 mg for the past month and a half and has been tolerating it reasonably well.  Reviewed potential side effects with rosuvastatin including myopathies muscle aches, potential risk for advancing to diabetes at early pace, in people with predisposition for diabetes.  Alternatives nonpharmacological briefly reviewed but efficacy unclear has been restated.

## 2023-04-02 LAB — BASIC METABOLIC PANEL
BUN/Creatinine Ratio: 11 (ref 10–24)
BUN: 14 mg/dL (ref 8–27)
CO2: 24 mmol/L (ref 20–29)
Calcium: 9.3 mg/dL (ref 8.6–10.2)
Chloride: 106 mmol/L (ref 96–106)
Creatinine, Ser: 1.26 mg/dL (ref 0.76–1.27)
Glucose: 92 mg/dL (ref 70–99)
Potassium: 4.5 mmol/L (ref 3.5–5.2)
Sodium: 144 mmol/L (ref 134–144)
eGFR: 64 mL/min/{1.73_m2} (ref >59)

## 2023-04-08 ENCOUNTER — Encounter (HOSPITAL_COMMUNITY): Payer: Self-pay

## 2023-04-13 ENCOUNTER — Emergency Department (HOSPITAL_BASED_OUTPATIENT_CLINIC_OR_DEPARTMENT_OTHER): Payer: 59

## 2023-04-13 ENCOUNTER — Other Ambulatory Visit: Payer: Self-pay

## 2023-04-13 ENCOUNTER — Encounter (HOSPITAL_BASED_OUTPATIENT_CLINIC_OR_DEPARTMENT_OTHER): Payer: Self-pay | Admitting: Radiology

## 2023-04-13 ENCOUNTER — Telehealth: Payer: Self-pay

## 2023-04-13 ENCOUNTER — Encounter (HOSPITAL_BASED_OUTPATIENT_CLINIC_OR_DEPARTMENT_OTHER): Payer: Self-pay

## 2023-04-13 ENCOUNTER — Ambulatory Visit (HOSPITAL_BASED_OUTPATIENT_CLINIC_OR_DEPARTMENT_OTHER)
Admission: RE | Admit: 2023-04-13 | Discharge: 2023-04-13 | Disposition: A | Discharge status: Home / Self Care | Payer: 59 | Source: Ambulatory Visit

## 2023-04-13 ENCOUNTER — Emergency Department (HOSPITAL_BASED_OUTPATIENT_CLINIC_OR_DEPARTMENT_OTHER)
Admission: EM | Admit: 2023-04-13 | Discharge: 2023-04-13 | Disposition: A | Discharge status: Home / Self Care | Payer: 59 | Attending: Emergency Medicine | Admitting: Emergency Medicine

## 2023-04-13 ENCOUNTER — Telehealth: Payer: Self-pay | Admitting: General Practice

## 2023-04-13 DIAGNOSIS — R519 Headache, unspecified: Secondary | ICD-10-CM

## 2023-04-13 DIAGNOSIS — Z79899 Other long term (current) drug therapy: Secondary | ICD-10-CM | POA: Insufficient documentation

## 2023-04-13 DIAGNOSIS — I2510 Atherosclerotic heart disease of native coronary artery without angina pectoris: Secondary | ICD-10-CM

## 2023-04-13 DIAGNOSIS — I10 Essential (primary) hypertension: Secondary | ICD-10-CM | POA: Diagnosis present

## 2023-04-13 DIAGNOSIS — R030 Elevated blood-pressure reading, without diagnosis of hypertension: Secondary | ICD-10-CM

## 2023-04-13 DIAGNOSIS — Z85818 Personal history of malignant neoplasm of other sites of lip, oral cavity, and pharynx: Secondary | ICD-10-CM | POA: Insufficient documentation

## 2023-04-13 LAB — CBC WITH DIFFERENTIAL/PLATELET
Abs Immature Granulocytes: 0.01 10*3/uL (ref 0.00–0.07)
Basophils Absolute: 0 10*3/uL (ref 0.0–0.1)
Basophils Relative: 0 %
Eosinophils Absolute: 0 10*3/uL (ref 0.0–0.5)
Eosinophils Relative: 1 %
HCT: 39.2 % (ref 39.0–52.0)
Hemoglobin: 13.5 g/dL (ref 13.0–17.0)
Immature Granulocytes: 0 %
Lymphocytes Relative: 13 %
Lymphs Abs: 0.8 10*3/uL (ref 0.7–4.0)
MCH: 30.8 pg (ref 26.0–34.0)
MCHC: 34.4 g/dL (ref 30.0–36.0)
MCV: 89.5 fL (ref 80.0–100.0)
Monocytes Absolute: 0.4 10*3/uL (ref 0.1–1.0)
Monocytes Relative: 6 %
Neutro Abs: 4.7 10*3/uL (ref 1.7–7.7)
Neutrophils Relative %: 80 %
Platelets: 174 10*3/uL (ref 150–400)
RBC: 4.38 MIL/uL (ref 4.22–5.81)
RDW: 12.9 % (ref 11.5–15.5)
WBC: 5.8 10*3/uL (ref 4.0–10.5)
nRBC: 0 % (ref 0.0–0.2)

## 2023-04-13 LAB — BASIC METABOLIC PANEL
Anion gap: 12 (ref 5–15)
BUN: 15 mg/dL (ref 8–23)
CO2: 23 mmol/L (ref 22–32)
Calcium: 9.5 mg/dL (ref 8.9–10.3)
Chloride: 103 mmol/L (ref 98–111)
Creatinine, Ser: 1.14 mg/dL (ref 0.61–1.24)
GFR, Estimated: >60 mL/min (ref >60)
Glucose, Bld: 112 mg/dL — ABNORMAL HIGH (ref 70–99)
Potassium: 4.5 mmol/L (ref 3.5–5.1)
Sodium: 138 mmol/L (ref 135–145)

## 2023-04-13 LAB — TROPONIN I (HIGH SENSITIVITY)
Troponin I (High Sensitivity): 7 ng/L (ref <18)
Troponin I (High Sensitivity): 8 ng/L (ref <18)

## 2023-04-13 MED ORDER — IOHEXOL 350 MG/ML SOLN
100.0000 mL | Freq: Once | INTRAVENOUS | Status: AC | PRN
  Administered 2023-04-13: 100 mL via INTRAVENOUS

## 2023-04-13 MED ORDER — ONDANSETRON HCL 4 MG/2ML IJ SOLN: 4.0000 mg | Freq: Once | INTRAMUSCULAR | Status: DC

## 2023-04-13 MED ORDER — DIPHENHYDRAMINE HCL 50 MG/ML IJ SOLN
12.5000 mg | Freq: Once | INTRAMUSCULAR | Status: AC
  Administered 2023-04-13: 12.5 mg via INTRAVENOUS
  Filled 2023-04-13: qty 1

## 2023-04-13 MED ORDER — AMLODIPINE BESYLATE 5 MG PO TABS: 5.0000 mg | ORAL_TABLET | Freq: Every day | ORAL | 0 refills | Status: DC

## 2023-04-13 MED ORDER — AMLODIPINE BESYLATE 5 MG PO TABS
5.0000 mg | ORAL_TABLET | Freq: Once | ORAL | Status: AC
  Administered 2023-04-13: 5 mg via ORAL
  Filled 2023-04-13: qty 1

## 2023-04-13 MED ORDER — IRBESARTAN 150 MG PO TABS
150.0000 mg | ORAL_TABLET | Freq: Every day | ORAL | Status: DC
  Administered 2023-04-13: 150 mg via ORAL
  Filled 2023-04-13: qty 1

## 2023-04-13 MED ORDER — PROCHLORPERAZINE EDISYLATE 10 MG/2ML IJ SOLN
10.0000 mg | Freq: Once | INTRAMUSCULAR | Status: AC
  Administered 2023-04-13: 10 mg via INTRAVENOUS
  Filled 2023-04-13: qty 2

## 2023-04-13 MED ORDER — NITROGLYCERIN 0.4 MG SL SUBL: 0.8000 mg | SUBLINGUAL_TABLET | Freq: Once | SUBLINGUAL | Status: DC

## 2023-04-13 NOTE — Discharge Instructions (Addendum)
It was a pleasure taking care of you today.  As discussed, your lab work was reassuring.  CT scan did not show any brain bleed.  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  I am restarting you on blood pressure medication.  Take 5 mg of amlodipine daily. Please follow-up with cardiology this week.  Return to the ER for new or worsening symptoms.

## 2023-04-13 NOTE — ED Notes (Signed)
Reviewed discharge instructions and follow up with pt. Pt states understanding. Ambulatory at discharge 

## 2023-04-13 NOTE — Telephone Encounter (Signed)
Patient was seen in the [facility] ED today, he was just discharged. While attempting to do his CT this morning his BP got way to high and they sent him to the ED to get checked out. They sent in a RX to [facility] and wanted to see what Dr. Cannon Kettle would suggest he take. CB # [id number]

## 2023-04-13 NOTE — Progress Notes (Signed)
Patient presents for coronary CT scan.   Patient BP 211/130 (left arm) and 213/117 (right arm) HR 65-75.   Patient reports not feeling well, reports headache and nausea.   Reports being weaned off of his BP meds, currently not taking.   Spoke to Dr. Bing Matter, recommend postponing CT scan and being evaluated in the ER.   Patient assisted to ER for evaluation.

## 2023-04-13 NOTE — ED Triage Notes (Signed)
Came in for coronary CT, was having hypertension prior to CT reading 230/117. Recently was taken off BP meds. Headache/nausea this morning, denies chest pain.   Took metoprolol prior to coming.

## 2023-04-13 NOTE — ED Provider Notes (Signed)
[facility] Provider Note   CSN: [id number] Arrival date & time: 04/13/23  7425     History  Chief Complaint  Patient presents with   Hypertension    Benjamin Mccullough is a 62 y.o. male with a past medical history significant for hypertension, hyperlipidemia, history of tonsillar fossa cancer who presents to the ED due to elevated BP.  Patient was scheduled for a coronary CT with cardiology where his BP was checked which was 230/117.  Patient states he woke up with a headache this morning associated with nausea and dizziness which he describes as lightheadedness.  Patient states roughly 6 weeks ago he was advised to cut his blood pressure medication in half due to low blood pressures and dizzy spells.  At that time he was on amlodipine-olmesartan 5-20mg .  Patient notes he continued having these dizzy spells after cutting his BP medications in half and was advised to stop taking his medication roughly 1 week ago.  He notes his BP has improved due to lifestyle changes.  He has lost 20 pounds in the past few months.  He was recently placed on Crestor due to a high calcium score.  Patient also has a known thoracic aortic aneurysm.  Denies any chest pain, back pain, or abdominal pain.  He notes he woke up this morning with a headache.  Denies visual and speech changes.  No unilateral weakness.  He notes he has been under increased stress given his wife's father is currently in hospice.  They were visiting him over the weekend and he returned to town for his coronary CT scan today.  No recent head injury. Patient also took Metoprolol 50mg  this morning in preparation for his CT scan.  History obtained from patient and past medical records. No interpreter used during encounter.       Home Medications Prior to Admission medications   Medication Sig Start Date End Date Taking? Authorizing Provider  amLODipine (NORVASC) 5 MG tablet Take 1 tablet (5 mg  total) by mouth daily. 04/13/23  Yes Alora Gorey C, PA-C  amLODipine-olmesartan (AZOR) 5-20 MG tablet TAKE 1 TABLET BY MOUTH DAILY. NEEDS APPOINTMENT. Patient not taking: Reported on 04/01/2023 02/08/23   Everrett Coombe, DO  metoprolol tartrate (LOPRESSOR) 50 MG tablet Take 1 tablet by mouth 2 hours before your CT scan 04/01/23   Madireddy, Marlyn Corporal, MD  rosuvastatin (CRESTOR) 20 MG tablet Take 1 tablet (20 mg total) by mouth daily. 02/17/23   Everrett Coombe, DO      Allergies    Penicillins    Review of Systems   Review of Systems  Eyes:  Negative for visual disturbance.  Respiratory:  Negative for shortness of breath.   Cardiovascular:  Negative for chest pain.  Gastrointestinal:  Negative for abdominal pain.  Musculoskeletal:  Negative for back pain.  Neurological:  Positive for dizziness and headaches. Negative for speech difficulty and weakness.    Physical Exam Updated Vital Signs BP (!) 165/96   Pulse 74   Temp 98.1 F (36.7 C)   Resp 16   Ht 6\' 2"  (1.88 m)   Wt 87.5 kg   SpO2 100%   BMI 24.78 kg/m  Physical Exam Vitals and nursing note reviewed.  Constitutional:      General: He is not in acute distress.    Appearance: He is not ill-appearing.  HENT:     Head: Normocephalic.  Eyes:     Pupils: Pupils are equal,  round, and reactive to light.  Cardiovascular:     Rate and Rhythm: Normal rate and regular rhythm.     Pulses: Normal pulses.     Heart sounds: Normal heart sounds. No murmur heard.    No friction rub. No gallop.  Pulmonary:     Effort: Pulmonary effort is normal.     Breath sounds: Normal breath sounds.  Abdominal:     General: Abdomen is flat. There is no distension.     Palpations: Abdomen is soft.     Tenderness: There is no abdominal tenderness. There is no guarding or rebound.  Musculoskeletal:        General: Normal range of motion.     Cervical back: Neck supple.  Skin:    General: Skin is warm and dry.  Neurological:      General: No focal deficit present.     Mental Status: He is alert.     Comments: Speech is clear, able to follow commands CN III-XII intact Normal strength in upper and lower extremities bilaterally including dorsiflexion and plantar flexion, strong and equal grip strength Sensation grossly intact throughout Moves extremities without ataxia, coordination intact No pronator drift Ambulates without difficulty  Psychiatric:        Mood and Affect: Mood normal.        Behavior: Behavior normal.     ED Results / Procedures / Treatments   Labs (all labs ordered are listed, but only abnormal results are displayed) Labs Reviewed  BASIC METABOLIC PANEL - Abnormal; Notable for the following components:      Result Value   Glucose, Bld 112 (*)    All other components within normal limits  CBC WITH DIFFERENTIAL/PLATELET  TROPONIN I (HIGH SENSITIVITY)  TROPONIN I (HIGH SENSITIVITY)    EKG EKG Interpretation Date/Time:  Tuesday April 13 2023 09:39:54 EST Ventricular Rate:  86 PR Interval:  180 QRS Duration:  116 QT Interval:  426 QTC Calculation: 510 R Axis:   84  Text Interpretation: Sinus rhythm Atrial premature complex Nonspecific intraventricular conduction delay Confirmed by Vivi Barrack [phone]) on 04/13/2023 12:14:14 PM  Radiology CT Angio Chest/Abd/Pel for Dissection W and/or Wo Contrast  Result Date: 04/13/2023 CLINICAL DATA:  Hypertension, nausea, malaise, history of squamous cell head and neck cancer * Tracking Code: BO * EXAM: CT ANGIOGRAPHY CHEST, ABDOMEN AND PELVIS TECHNIQUE: Non-contrast CT of the chest was initially obtained. Multidetector CT imaging through the chest, abdomen and pelvis was performed using the standard protocol during bolus administration of intravenous contrast. Multiplanar reconstructed images and MIPs were obtained and reviewed to evaluate the vascular anatomy. RADIATION DOSE REDUCTION: This exam was performed according to the departmental  dose-optimization program which includes automated exposure control, adjustment of the mA and/or kV according to patient size and/or use of iterative reconstruction technique. CONTRAST:  OMNIPAQUE IOHEXOL 350 MG/ML SOLN COMPARISON:  CT chest, 01/05/2023, PET-CT, 03/21/2019 FINDINGS: CTA CHEST FINDINGS VASCULAR Aorta: Satisfactory opacification of the aorta. Scattered aortic atherosclerosis. Tubular ascending thoracic aorta measures up to 4.2 x 4.1 cm (series 302, image 62). Normal caliber of the distal arch and descending thoracic aorta. No evidence of dissection or other acute aortic pathology. Cardiovascular: No evidence of pulmonary embolism on limited non-tailored examination. Normal heart size. Left coronary artery calcifications. No pericardial effusion. Review of the MIP images confirms the above findings. NON VASCULAR Mediastinum/Nodes: No enlarged mediastinal, hilar, or axillary lymph nodes. Thyroid gland, trachea, and esophagus demonstrate no significant findings. Lungs/Pleura: Lungs are clear. No  pleural effusion or pneumothorax. Musculoskeletal: No chest wall abnormality. No acute osseous findings. Review of the MIP images confirms the above findings. CTA ABDOMEN AND PELVIS FINDINGS VASCULAR Normal contour and caliber of the abdominal aorta. No evidence of aneurysm, dissection, or other acute aortic pathology. 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 Mild mixed calcific atherosclerosis. Review of the MIP images confirms the above findings. NON-VASCULAR Hepatobiliary: No solid liver abnormality is seen. No gallstones, gallbladder wall thickening, or biliary dilatation. Pancreas: Unremarkable. No pancreatic ductal dilatation or surrounding inflammatory changes. Spleen: Normal in size without significant abnormality. Adrenals/Urinary Tract: Adrenal glands are unremarkable. Simple, benign inferior  pole left renal cortical cysts, for which no further follow-up or characterization is required. Kidneys are otherwise normal, without renal calculi, solid lesion, or hydronephrosis. Bladder is unremarkable. Stomach/Bowel: Stomach is within normal limits. 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 No evidence of volvulus or other acute complication. Appendix not clearly visualized. No evidence of bowel wall thickening, distention, or inflammatory changes. Lymphatic: No enlarged abdominal or pelvic lymph nodes. Reproductive: Prostatomegaly. Other: No abdominal wall hernia or abnormality. No ascites. Musculoskeletal: No acute osseous findings. IMPRESSION: 1. No evidence of aortic dissection or other acute aortic pathology. Mild aortic atherosclerosis. 2. Unchanged tubular ascending thoracic aorta measures up to 4.2 x 4.1 cm. Normal caliber of the remainder of the aorta. Recommend annual imaging followup by CTA or MRA if not otherwise imaged and if clinically appropriate. This recommendation follows 2010 ACCF/AHA/AATS/ACR/ASA/SCA/SCAI/SIR/STS/SVM Guidelines for the Diagnosis and Management of Patients with Thoracic Aortic Disease. Circulation. 2010; 121: J191-Y782. Aortic aneurysm NOS (ICD10-I71.9) 3. 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 No evidence of volvulus or other acute complication. This is of doubtful clinical significance as an incidental finding in adulthood. 4. Coronary artery disease. 5. No evidence of lymphadenopathy or metastatic disease in the chest, abdomen, or pelvis. Aortic Atherosclerosis (ICD10-I70.0). Electronically Signed   By: Jearld Lesch M.D.   On: 04/13/2023 11:14   CT Head Wo Contrast  Result Date: 04/13/2023 CLINICAL DATA:  Headache.  Sudden and severe. EXAM: CT HEAD WITHOUT CONTRAST TECHNIQUE: Contiguous axial images were obtained from the base of the skull through the vertex without intravenous contrast. RADIATION DOSE REDUCTION: This exam  was performed according to the departmental dose-optimization program which includes automated exposure control, adjustment of the mA and/or kV according to patient size and/or use of iterative reconstruction technique. COMPARISON:  03/28/2019 FINDINGS: Brain: The brain shows a normal appearance without evidence of malformation, atrophy, old or acute small or large vessel infarction, mass lesion, hemorrhage, hydrocephalus or extra-axial collection. Vascular: No hyperdense vessel. No evidence of atherosclerotic calcification. Skull: Normal.  No traumatic finding.  No focal bone lesion. Sinuses/Orbits: Sinuses are clear. Orbits appear normal. Mastoids are clear. Other: None significant IMPRESSION: Normal head CT. Electronically Signed   By: Paulina Fusi M.D.   On: 04/13/2023 11:13    Procedures Procedures    Medications Ordered in ED Medications  irbesartan (AVAPRO) tablet 150 mg (150 mg Oral Given 04/13/23 1008)  amLODipine (NORVASC) tablet 5 mg (5 mg Oral Given 04/13/23 1008)  prochlorperazine (COMPAZINE) injection 10 mg (10 mg Intravenous Given 04/13/23 1025)  diphenhydrAMINE (BENADRYL) injection 12.5 mg (12.5 mg Intravenous Given 04/13/23 1024)  iohexol (OMNIPAQUE) 350 MG/ML injection 100 mL (100 mLs Intravenous Contrast Given 04/13/23 1041)    ED Course/ Medical Decision Making/ A&P Clinical Course  as of 04/13/23 1347  Tue Apr 13, 2023  0939 BP(!): 167/104 [CA]  1011 Informed by RN that patient states he feels like something is standing on his chest however, denies pain or shortness of breath.  Dissection study ordered given known thoracic aortic aneurysm. [CA]  1214 Reassessed patient at bedside.  Notes his headache has significantly improved.  Almost completely resolved.  BP improved to 160 systolic. [CA]  1315 Reassessed patient.  BP improved to 150 systolic. [CA]    Clinical Course User Index [CA] Mannie Stabile, PA-C                                 Medical Decision  Making Amount and/or Complexity of Data Reviewed External Data Reviewed: notes. Labs: ordered. Decision-making details documented in ED Course. Radiology: ordered and independent interpretation performed. Decision-making details documented in ED Course. ECG/medicine tests: ordered and independent interpretation performed. Decision-making details documented in ED Course.  Risk Prescription drug management.   This patient presents to the ED for concern of headache, this involves an extensive number of treatment options, and is a complaint that carries with it a high risk of complications and morbidity.  The differential diagnosis includes intracranial bleed, CVA, migraine, hypertensive emergency, etc  62 year old male presents to the ED due to elevated BP.  Patient notes he woke up this morning with a headache associated with nausea and dizziness which he describes as lightheadedness.  Patient recently taken off his BP medications 1 week ago due to dizzy spells and low blood pressures.  Patient took metoprolol 50 mg earlier today in preparation was for his coronary CT scan per cardiology.  Patient not currently on any blood pressure medications daily.  Has a known thoracic aortic aneurysm.  Denies chest pain, shortness of breath, abdominal pain, and back pain.  Upon arrival in triage BP 167/104.  BP rechecked during initial evaluation which was 194/115.  Normal neurological exam without any neurological deficits.  CT head to rule out intracranial bleed.  Migraine cocktail given.  Routine labs ordered to rule out endorgan damage.  No chest pain, shortness of breath, abdominal pain, or back pain.  Low suspicion of rupture of thoracic aortic aneurysm. Oral BP medication that he was previously on 1 week ago given.   CBC unremarkable.  No leukocytosis.  Normal hemoglobin.  BMP reassuring.  Normal renal function.  No major electrolyte derangements.  Troponin x 2 normal.  EKG normal sinus rhythm.  Low  suspicion for ACS.  CT head personally reviewed and interpreted which is negative for any acute abnormalities.  No intracranial bleed. Agree with radiology.  CT dissection study negative for aortic dissection.  Unchanged ascending thoracic aorta measuring 4.2.  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  During patient's ED stay BP improved.  Headache improved. No neurological symptoms to suggest CVA.  Low suspicion for hypertensive emergency given reassuring labs and symptomatic improvement.  Suspect patient has underlying hypertension which needs to be treated with blood pressure medications.  Will restart patient on amlodipine 5 mg and have him follow-up with his cardiologist this week for further recommendations.  Low suspicion for PE, ACS, or aortic dissection.  Patient stable for discharge. Strict ED precautions discussed with patient. Patient states understanding and agrees to plan. Patient discharged home in no acute distress and stable vitals  Discussed with Dr. Jearld Fenton who evaluated patient at bedside  and agrees with assessment and plan.  Co morbidities that complicate the patient evaluation  Thoracic aortic aneurysm Cardiac Monitoring: / EKG:  The patient was maintained on a cardiac monitor.  I personally viewed and interpreted the cardiac monitored which showed an underlying rhythm of: NSR  Social Determinants of Health:  Has PCP        Final Clinical Impression(s) / ED Diagnoses Final diagnoses:  Elevated blood pressure reading  Acute nonintractable headache, unspecified headache type    Rx / DC Orders ED Discharge Orders          Ordered    amLODipine (NORVASC) 5 MG tablet  Daily        04/13/23 1345              Mannie Stabile, PA-C 04/13/23 1347    Loetta Rough, MD 04/17/23 1428

## 2023-04-13 NOTE — Transitions of Care (Post Inpatient/ED Visit) (Signed)
   04/13/2023  Name: Benjamin Mccullough MRN: [id number] DOB: [birth year]  Today's TOC FU Call Status: Today's TOC FU Call Status:: Successful TOC FU Call Completed TOC FU Call Complete Date: 04/13/23 Patient's Name and Date of Birth confirmed.  Transition Care Management Follow-up Telephone Call Date of Discharge: 04/13/23 Discharge Facility: [facility] Type of Discharge: Emergency Department Reason for ED Visit: Other: (headache and elevated BP) How have you been since you were released from the hospital?: Better Any questions or concerns?: No  Items Reviewed: Did you receive and understand the discharge instructions provided?: Yes Medications obtained,verified, and reconciled?: Yes (Medications Reviewed) Any new allergies since your discharge?: No Dietary orders reviewed?: NA Do you have support at home?: Yes  Medications Reviewed Today: Medications Reviewed Today     Reviewed by Modesto Charon, RN (Registered Nurse) on 04/13/23 at 1535  Med List Status: <None>   Medication Order Taking? Sig Documenting Provider Last Dose Status Informant  amLODipine (NORVASC) 5 MG tablet [id number] Yes Take 1 tablet (5 mg total) by mouth daily. Mannie Stabile, PA-C Taking Active   amLODipine-olmesartan (AZOR) 5-20 MG tablet [id number] No TAKE 1 TABLET BY MOUTH DAILY. NEEDS APPOINTMENT.  Patient not taking: Reported on 04/01/2023   Everrett Coombe, DO Not Taking Active   rosuvastatin (CRESTOR) 20 MG tablet [id number] Yes Take 1 tablet (20 mg total) by mouth daily. Everrett Coombe, DO Taking Active             Home Care and Equipment/Supplies: Were Home Health Services Ordered?: NA Any new equipment or medical supplies ordered?: NA  Functional Questionnaire: Do you need assistance with bathing/showering or dressing?: No Do you need assistance with meal preparation?: No Do you need assistance with eating?: No Do you have difficulty maintaining continence: No Do you need  assistance with getting out of bed/getting out of a chair/moving?: No Do you have difficulty managing or taking your medications?: No  Follow up appointments reviewed: Specialist Hospital Follow-up appointment confirmed?: NA Do you need transportation to your follow-up appointment?: No Do you understand care options if your condition(s) worsen?: Yes-patient verbalized understanding  SDOH Interventions Today    Flowsheet Row Most Recent Value  SDOH Interventions   Transportation Interventions Intervention Not Indicated       SIGNATURE Modesto Charon, RN BSN Nurse Health Advisor

## 2023-04-14 ENCOUNTER — Ambulatory Visit: Payer: 59 | Admitting: Family Medicine

## 2023-04-14 NOTE — Telephone Encounter (Signed)
Result message from Dr. Vincent Gros. Pt verbalized understanding and had no additional questions.   Recommend him to start amlodipine prescription be sent , which is 5 mg once daily.  Also resume olmesartan 20 mg once daily.

## 2023-04-26 ENCOUNTER — Telehealth: Payer: Self-pay

## 2023-04-26 ENCOUNTER — Other Ambulatory Visit: Payer: Self-pay

## 2023-04-26 MED ORDER — METOPROLOL TARTRATE 50 MG PO TABS: ORAL_TABLET | ORAL | 0 refills | Status: DC

## 2023-04-26 NOTE — Telephone Encounter (Signed)
Left VM that medication needs to be taken 2 hours prior to the CT scan on 11/26. MyChart message sent as well.

## 2023-04-26 NOTE — Telephone Encounter (Signed)
Pt called stating that his CT was rescheduled and he needs the Metoprolol called to [facility] for his procedure tomorrow. Metoprolol 50mg  1 tablet 2 hours prior to CT scan per Dr. Madireddy's note sent to [facility].

## 2023-04-26 NOTE — Telephone Encounter (Signed)
Patient would like to know if he needs to take Metoprolol prior to 11/26 CT [facility]. Please advise.

## 2023-04-27 ENCOUNTER — Encounter (HOSPITAL_BASED_OUTPATIENT_CLINIC_OR_DEPARTMENT_OTHER): Payer: Self-pay

## 2023-04-27 ENCOUNTER — Ambulatory Visit (HOSPITAL_BASED_OUTPATIENT_CLINIC_OR_DEPARTMENT_OTHER)
Admission: RE | Admit: 2023-04-27 | Discharge: 2023-04-27 | Disposition: A | Discharge status: Home / Self Care | Payer: 59 | Source: Ambulatory Visit

## 2023-04-27 ENCOUNTER — Ambulatory Visit (HOSPITAL_BASED_OUTPATIENT_CLINIC_OR_DEPARTMENT_OTHER)
Admission: RE | Admit: 2023-04-27 | Discharge: 2023-04-27 | Disposition: A | Discharge status: Home / Self Care | Payer: 59 | Source: Ambulatory Visit | Attending: Cardiology | Admitting: Cardiology

## 2023-04-27 ENCOUNTER — Other Ambulatory Visit: Payer: Self-pay | Admitting: Cardiology

## 2023-04-27 DIAGNOSIS — I2510 Atherosclerotic heart disease of native coronary artery without angina pectoris: Secondary | ICD-10-CM | POA: Insufficient documentation

## 2023-04-27 DIAGNOSIS — R931 Abnormal findings on diagnostic imaging of heart and coronary circulation: Secondary | ICD-10-CM

## 2023-04-27 MED ORDER — DILTIAZEM HCL 25 MG/5ML IV SOLN
10.0000 mg | Freq: Once | INTRAVENOUS | Status: AC
  Administered 2023-04-27: 10 mg via INTRAVENOUS

## 2023-04-27 MED ORDER — NITROGLYCERIN 0.4 MG SL SUBL
0.8000 mg | SUBLINGUAL_TABLET | Freq: Once | SUBLINGUAL | Status: AC
  Administered 2023-04-27: 0.8 mg via SUBLINGUAL

## 2023-04-27 MED ORDER — METOPROLOL TARTRATE 5 MG/5ML IV SOLN
5.0000 mg | INTRAVENOUS | Status: DC | PRN
  Administered 2023-04-27: 5 mg via INTRAVENOUS

## 2023-04-27 MED ORDER — IOHEXOL 350 MG/ML SOLN
100.0000 mL | Freq: Once | INTRAVENOUS | Status: AC | PRN
  Administered 2023-04-27: 95 mL via INTRAVENOUS

## 2023-04-27 NOTE — Progress Notes (Signed)
FFR order

## 2023-04-28 ENCOUNTER — Ambulatory Visit (HOSPITAL_BASED_OUTPATIENT_CLINIC_OR_DEPARTMENT_OTHER)
Admission: RE | Admit: 2023-04-28 | Discharge: 2023-04-28 | Disposition: A | Discharge status: Home / Self Care | Payer: 59 | Source: Ambulatory Visit

## 2023-04-28 DIAGNOSIS — I2510 Atherosclerotic heart disease of native coronary artery without angina pectoris: Secondary | ICD-10-CM | POA: Insufficient documentation

## 2023-04-28 LAB — ECHOCARDIOGRAM COMPLETE
AR max vel: 2.79 cm2
AV Area VTI: 2.65 cm2
AV Area mean vel: 2.73 cm2
AV Mean grad: 3.7 mm[Hg]
AV Peak grad: 5.6 mm[Hg]
Ao pk vel: 1.19 m/s
Area-P 1/2: 2.13 cm2
Calc EF: 56.4 %
S' Lateral: 3.7 cm
Single Plane A2C EF: 58.6 %
Single Plane A4C EF: 55.3 %

## 2023-05-15 ENCOUNTER — Other Ambulatory Visit: Payer: Self-pay | Admitting: Family Medicine

## 2023-05-23 NOTE — Progress Notes (Unsigned)
Cardiology Consultation:    Date:  05/24/2023   ID:  Benjamin Mccullough, DOB March 08, 1961, MRN [id number]  PCP:  Benjamin Coombe, DO  Cardiologist:  Benjamin Corporal Lorene Samaan, MD   Referring MD: Benjamin Coombe, DO   Chief Complaint  Patient presents with   Follow-up     ASSESSMENT AND PLAN:   Benjamin Mccullough 62 year old male with history of coronary artery disease on prior CT coronary angiogram imaging, mildly dilated thoracic aorta, hypertension, hyperlipidemia, throat cancer s/p chemoradiation therapy completed in 2020, frequent atrial ectopy, now with echocardiogram reporting mildly reduced LVEF although on my review this appears to be likely an underestimated LVEF in the setting of frequent atrial ectopy, and appears closer to 50 to 55% low normal function without significant regional wall motion abnormalities.   Problem List Items Addressed This Visit     Primary hypertension   Suboptimally controlled today. Continue olmesartan amlodipine combination medication 20 mg - 5 mg once a day.  Target below 130 over 80 mmHg. Start carvedilol 3.125 mg twice daily.  Discussed potential side effects and benefits of the medication including slow heart rate, fatigue, tiredness, erectile dysfunction, reduced libido at higher doses.  If blood pressures tend to run low after starting this medicine, will switch from olmesartan-to be in combination to single olmesartan medication.       Relevant Medications   aspirin EC 81 MG tablet   carvedilol (COREG) 3.125 MG tablet   Thoracic aortic aneurysm (TAA) (HCC) measuring 4.1 to 4.3 cm on CT imaging 02/16/2023; had follow-up with CT surgeon; has annual visits   Still continues to get CT chest for surveillance after his neck cancer. Following up with vascular surgeon for any interval change in thoracic aortic aneurysm.      Relevant Medications   aspirin EC 81 MG tablet   carvedilol (COREG) 3.125 MG tablet   CAD CT coronary angiogram 04/27/2023 calcium  score 1129, CAD RADS 3 study 50 to 69% mid RCA and mid LAD lesion.  CT FFR low likelihood of obstruction.  Plaque volume high 1475 mm3 - Primary   Risk reduction and preventing progression of the coronary atherosclerosis reviewed.  Continue aspirin 81 mg once daily continue rosuvastatin 20 mg once daily. If his lipid panels are not below 70 mg/dL, would recommend further titrating up the lipid-lowering therapy by introducing PCSK9 inhibitors.      Relevant Medications   aspirin EC 81 MG tablet   carvedilol (COREG) 3.125 MG tablet   Hyperlipidemia   Lipid Panel     Component Value Date/Time   CHOL 192 07/27/2022 1002   TRIG 73 07/27/2022 1002   HDL 56 07/27/2022 1002   CHOLHDL 3.4 07/27/2022 1002   VLDL 12.8 10/04/2019 0902   LDLCALC 119 (H) 07/27/2022 1002   Mentions he has had a more recent lipid panel done through PCPs office including LP(a) levels.  Advised to have these forwarded to our office for review.       Relevant Medications   aspirin EC 81 MG tablet   carvedilol (COREG) 3.125 MG tablet   Ectopic atrial beats   Frequent, observed on prior EKGs and transthoracic echocardiogram imaging.  Reviewed role of carvedilol and lowering these extra beats to him.       Relevant Medications   aspirin EC 81 MG tablet   carvedilol (COREG) 3.125 MG tablet   Nonischemic cardiomyopathy (HCC), mild LVEF reduction 40 to 45% on echocardiogram 04/28/2023, appears low normal on my review  Reduced LVEF reported in the setting of frequent PACs on echocardiogram. No regional wall motion abnormality. Appears low normal EF on my review.  He is asymptomatic. Euvolemic, compensated.  At this time reviewed the findings with him and recommended repeat imaging in couple months. Will introduce carvedilol in addition to his current olmesartan amlodipine medication as above. If repeat echocardiogram in couple months show LVEF to still be low, will consider cardiac MRI.       Relevant  Medications   aspirin EC 81 MG tablet   carvedilol (COREG) 3.125 MG tablet   Other Relevant Orders   ECHOCARDIOGRAM COMPLETE   Return to clinic tentatively in 3 months.   History of Present Illness:    Benjamin Mccullough is a 62 y.o. male who is being seen today for follow-up visit. Last visit with [place] was 04/01/2023. PCP is Benjamin Coombe, DO.   Pleasant male, here for the visit by himself.  Previously worked as a IT sales professional.  Has coronary artery disease [elevated calcium score on initial imaging 02/16/2023; CTA coronary angiogram done 04/27/2023 calcium score1129, CAD RADS 3 study 50 to 69% mid RCA and mid LAD stenosis, low likelihood of obstruction by CT FFR, severely elevated plaque volume 1475 mm3], hyperlipidemia, hypertension, mildly dilated thoracic aorta [ectatic thoracic aorta measuring 4.1 to 4.3 cm on CT imaging 02/16/2023], throat cancer s/p chemoradiation therapy completed in 2020.   Echocardiogram from 04/28/2023 reported LVEF 40 to 45%, no regional wall motion abnormalities were reported.  Grade 1 diastolic dysfunction.  Normal RV function.  No significant valve abnormalities were reported.  Images reviewed myself.  There appears to be frequent atrial ectopic beats which appears to result in underestimating of the LVEF.  LV systolic function in my opinion appears to be low normal 50 to 55%, with no regional wall motion abnormalities. Prior echocardiogram from 2015 November reported LVEF 55 to 60%.  Overall at home he has been doing well.  Has had to deal with stressful situations at home but the recent passing away of his father-in-law.  He also just heard about a friend of his diagnosed with major health issue.  When mentions he is currently excited about his son returning from deployment to spend time with family for the holidays.  He mentions doing good with regular exercise.  Mentions no significant symptoms of chest pain shortness of breath palpitations, lightheadedness,  syncopal episodes. Mentions blood pressures at home are well-controlled but at healthcare visits they tend to run high.  Mentions is being good with adhering to the medications as prescribed. With rosuvastatin daily has been started after CT coronary angiogram results he has noticed muscle aches which are similar to muscle aches after his exercise.  He notes discomfort from exercise which previously used last 1 day now tend to last up to 3 days.  He is however willing to continue with the statins as benefits outweigh the risks. He has been taking aspirin 81 mg once daily.    Past Medical History:  Diagnosis Date   Borderline hypertension    Cancer (HCC)    Hypertension    Skin cancer     Past Surgical History:  Procedure Laterality Date   ADENOIDECTOMY     KNEE ARTHROSCOPY W/ ACL RECONSTRUCTION      Current Medications: Current Meds  Medication Sig   amLODipine-olmesartan (AZOR) 5-20 MG tablet TAKE 1 TABLET BY MOUTH DAILY. NEEDS APPOINTMENT.   aspirin EC 81 MG tablet Take 81 mg by mouth daily. Swallow whole.  carvedilol (COREG) 3.125 MG tablet Take 1 tablet (3.125 mg total) by mouth 2 (two) times daily.   rosuvastatin (CRESTOR) 20 MG tablet Take 1 tablet (20 mg total) by mouth daily.     Allergies:   Penicillins   Social History   Socioeconomic History   Marital status: Married    Spouse name: Not on file   Number of children: 2   Years of education: Not on file   Highest education level: Not on file  Occupational History    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  Tobacco Use   Smoking status: Never    Passive exposure: Never   Smokeless tobacco: Never  Vaping Use   Vaping status: Never Used  Substance and Sexual Activity   Alcohol use: Yes    Alcohol/week: 1.0 standard drink of alcohol    Types: 1 Standard drinks or equivalent per week    Comment: Social   Drug use: No   Sexual activity: Yes    Partners: Female  Other Topics  Concern   Not on file  Social History Narrative   Not on file   Social Drivers of Health   Financial Resource Strain: Not on file  Food Insecurity: Not on file  Transportation Needs: No Transportation Needs (04/13/2023)   PRAPARE - Administrator, Civil Service (Medical): No    Lack of Transportation (Non-Medical): No  Physical Activity: Not on file  Stress: Not on file  Social Connections: Not on file     Family History: The patient's family history includes CAD in his father and mother; Heart attack in his mother; Hypertension in his father; Prostate cancer in his father; Stroke in his father. ROS:   Please see the history of present illness.    All 14 point review of systems negative except as described per history of present illness.  EKGs/Labs/Other Studies Reviewed:    The following studies were reviewed today:   EKG:       Recent Labs: 07/27/2022: ALT 18; Magnesium 2.0; TSH 4.44 04/13/2023: BUN 15; Creatinine, Ser 1.14; Hemoglobin 13.5; Platelets 174; Potassium 4.5; Sodium 138  Recent Lipid Panel    Component Value Date/Time   CHOL 192 07/27/2022 1002   TRIG 73 07/27/2022 1002   HDL 56 07/27/2022 1002   CHOLHDL 3.4 07/27/2022 1002   VLDL 12.8 10/04/2019 0902   LDLCALC 119 (H) 07/27/2022 1002    Physical Exam:    VS:  BP (!) 156/91 (BP Location: Left Arm, Patient Position: Sitting, Cuff Size: Small)   Pulse 91   Ht 6\' 2"  (1.88 m)   Wt 199 lb (90.3 kg)   SpO2 99%   BMI 25.55 kg/m     Wt Readings from Last 3 Encounters:  05/24/23 199 lb (90.3 kg)  04/13/23 193 lb (87.5 kg)  04/01/23 197 lb 12.8 oz (89.7 kg)     GENERAL:  Well nourished, well developed in no acute distress NECK: No JVD; No carotid bruits CARDIAC: RRR, S1 and S2 present, no murmurs, no rubs, no gallops CHEST:  Clear to auscultation without rales, wheezing or rhonchi  Extremities: No pitting pedal edema. Pulses bilaterally symmetric with radial 2+ and dorsalis pedis  2+ NEUROLOGIC:  Alert and oriented x 3  Medication Adjustments/Labs and Tests Ordered: Current medicines are reviewed at length with the patient today.  Concerns regarding medicines are outlined above.  Orders Placed This Encounter  Procedures   ECHOCARDIOGRAM COMPLETE  Meds ordered this encounter  Medications   carvedilol (COREG) 3.125 MG tablet    Sig: Take 1 tablet (3.125 mg total) by mouth 2 (two) times daily.    Dispense:  180 tablet    Refill:  3    Signed, Antwanette Wesche reddy Deakon Frix, MD, MPH, [facility]. 05/24/2023 9:08 AM    [facility]

## 2023-05-23 NOTE — Assessment & Plan Note (Signed)
Lipid Panel     Component Value Date/Time   CHOL 192 07/27/2022 1002   TRIG 73 07/27/2022 1002   HDL 56 07/27/2022 1002   CHOLHDL 3.4 07/27/2022 1002   VLDL 12.8 10/04/2019 0902   LDLCALC 119 (H) 07/27/2022 1002   Mentions he has had a more recent lipid panel done through PCPs office including LP(a) levels.  Advised to have these forwarded to our office for review.

## 2023-05-23 NOTE — Assessment & Plan Note (Signed)
Frequent, observed on prior EKGs and transthoracic echocardiogram imaging.  Reviewed role of carvedilol and lowering these extra beats to him.

## 2023-05-24 ENCOUNTER — Ambulatory Visit: Payer: 59

## 2023-05-24 VITALS — BP 156/91 | HR 91 | Ht 74.0 in | Wt 199.0 lb

## 2023-05-24 DIAGNOSIS — I712 Thoracic aortic aneurysm, without rupture, unspecified: Secondary | ICD-10-CM

## 2023-05-24 DIAGNOSIS — I2510 Atherosclerotic heart disease of native coronary artery without angina pectoris: Secondary | ICD-10-CM | POA: Diagnosis not present

## 2023-05-24 DIAGNOSIS — I10 Essential (primary) hypertension: Secondary | ICD-10-CM | POA: Diagnosis not present

## 2023-05-24 DIAGNOSIS — E782 Mixed hyperlipidemia: Secondary | ICD-10-CM

## 2023-05-24 DIAGNOSIS — I491 Atrial premature depolarization: Secondary | ICD-10-CM

## 2023-05-24 DIAGNOSIS — I428 Other cardiomyopathies: Secondary | ICD-10-CM

## 2023-05-24 HISTORY — DX: Other cardiomyopathies: I42.8

## 2023-05-24 MED ORDER — CARVEDILOL 3.125 MG PO TABS: 3.1250 mg | ORAL_TABLET | Freq: Two times a day (BID) | ORAL | 3 refills | Status: DC

## 2023-05-24 NOTE — Assessment & Plan Note (Signed)
Suboptimally controlled today. Continue olmesartan amlodipine combination medication 20 mg - 5 mg once a day.  Target below 130 over 80 mmHg. Start carvedilol 3.125 mg twice daily.  Discussed potential side effects and benefits of the medication including slow heart rate, fatigue, tiredness, erectile dysfunction, reduced libido at higher doses.  If blood pressures tend to run low after starting this medicine, will switch from olmesartan-to be in combination to single olmesartan medication.

## 2023-05-24 NOTE — Assessment & Plan Note (Signed)
Still continues to get CT chest for surveillance after his neck cancer. Following up with vascular surgeon for any interval change in thoracic aortic aneurysm.

## 2023-05-24 NOTE — Assessment & Plan Note (Signed)
Reduced LVEF reported in the setting of frequent PACs on echocardiogram. No regional wall motion abnormality. Appears low normal EF on my review.  He is asymptomatic. Euvolemic, compensated.  At this time reviewed the findings with him and recommended repeat imaging in couple months. Will introduce carvedilol in addition to his current olmesartan amlodipine medication as above. If repeat echocardiogram in couple months show LVEF to still be low, will consider cardiac MRI.

## 2023-05-24 NOTE — Patient Instructions (Signed)
Medication Instructions:  Your physician has recommended you make the following change in your medication:  Start taking Carvedilol 3.125 mg twice a day. *If you need a refill on your cardiac medications before your next appointment, please call your pharmacy*   Lab Work: None Ordered If you have labs (blood work) drawn today and your tests are completely normal, you will receive your results only by: MyChart Message (if you have MyChart) OR A paper copy in the mail If you have any lab test that is abnormal or we need to change your treatment, we will call you to review the results.   Testing/Procedures: Echocardiogram in 2 months  An echocardiogram is a test that uses sound waves (ultrasound) to produce images of the heart. Images from an echocardiogram can provide important information about: Heart size and shape. The size and thickness and movement of your heart's walls. Heart muscle function and strength. Heart valve function or if you have stenosis. Stenosis is when the heart valves are too narrow. If blood is flowing backward through the heart valves (regurgitation). A tumor or infectious growth around the heart valves. Areas of heart muscle that are not working well because of poor blood flow or injury from a heart attack. Aneurysm detection. An aneurysm is a weak or damaged part of an artery wall. The wall bulges out from the normal force of blood pumping through the body. Tell a health care provider about: Any allergies you have. All medicines you are taking, including vitamins, herbs, eye drops, creams, and over-the-counter medicines. Any blood disorders you have. Any surgeries you have had. Any medical conditions you have. Whether you are pregnant or may be pregnant. What are the risks? Generally, this is a safe test. However, problems may occur, including an allergic reaction to dye (contrast) that may be used during the test. What happens before the test? No specific  preparation is needed. You may eat and drink normally. What happens during the test?  You will take off your clothes from the waist up and put on a hospital gown. Electrodes or electrocardiogram (ECG)patches may be placed on your chest. The electrodes or patches are then connected to a device that monitors your heart rate and rhythm. You will lie down on a table for an ultrasound exam. A gel will be applied to your chest to help sound waves pass through your skin. A handheld device, called a transducer, will be pressed against your chest and moved over your heart. The transducer produces sound waves that travel to your heart and bounce back (or "echo" back) to the transducer. These sound waves will be captured in real-time and changed into images of your heart that can be viewed on a video monitor. The images will be recorded on a computer and reviewed by your health care provider. You may be asked to change positions or hold your breath for a short time. This makes it easier to get different views or better views of your heart. In some cases, you may receive contrast through an IV in one of your veins. This can improve the quality of the pictures from your heart. The procedure may vary among health care providers and hospitals. What can I expect after the test? You may return to your normal, everyday life, including diet, activities, and medicines, unless your health care provider tells you not to do that. Follow these instructions at home: It is up to you to get the results of your test. Ask your health care  provider, or the department that is doing the test, when your results will be ready. Keep all follow-up visits. This is important. Summary An echocardiogram is a test that uses sound waves (ultrasound) to produce images of the heart. Images from an echocardiogram can provide important information about the size and shape of your heart, heart muscle function, heart valve function, and other  possible heart problems. You do not need to do anything to prepare before this test. You may eat and drink normally. After the echocardiogram is completed, you may return to your normal, everyday life, unless your health care provider tells you not to do that. This information is not intended to replace advice given to you by your health care provider. Make sure you discuss any questions you have with your health care provider. Document Revised: 01/29/2021 Document Reviewed: 01/09/2020 Elsevier Patient Education  2023 Elsevier Inc.       Follow-Up: At [facility], you and your health needs are our priority.  As part of our continuing mission to provide you with exceptional heart care, we have created designated Provider Care Teams.  These Care Teams include your primary Cardiologist (physician) and Advanced Practice Providers (APPs -  Physician Assistants and Nurse Practitioners) who all work together to provide you with the care you need, when you need it.  We recommend signing up for the patient portal called "MyChart".  Sign up information is provided on this After Visit Summary.  MyChart is used to connect with patients for Virtual Visits (Telemedicine).  Patients are able to view lab/test results, encounter notes, upcoming appointments, etc.  Non-urgent messages can be sent to your provider as well.   To learn more about what you can do with MyChart, go to [email or web address].    Your next appointment:   3 month follow up with Dr. Vincent Gros

## 2023-05-24 NOTE — Assessment & Plan Note (Signed)
Risk reduction and preventing progression of the coronary atherosclerosis reviewed.  Continue aspirin 81 mg once daily continue rosuvastatin 20 mg once daily. If his lipid panels are not below 70 mg/dL, would recommend further titrating up the lipid-lowering therapy by introducing PCSK9 inhibitors.

## 2023-06-01 ENCOUNTER — Ambulatory Visit: Payer: 59 | Admitting: Family Medicine

## 2023-07-08 ENCOUNTER — Ambulatory Visit (HOSPITAL_COMMUNITY)
Admission: RE | Admit: 2023-07-08 | Discharge: 2023-07-08 | Disposition: A | Discharge status: Home / Self Care | Payer: 59 | Source: Ambulatory Visit

## 2023-07-08 DIAGNOSIS — E785 Hyperlipidemia, unspecified: Secondary | ICD-10-CM | POA: Insufficient documentation

## 2023-07-08 DIAGNOSIS — I429 Cardiomyopathy, unspecified: Secondary | ICD-10-CM | POA: Diagnosis present

## 2023-07-08 DIAGNOSIS — R079 Chest pain, unspecified: Secondary | ICD-10-CM | POA: Diagnosis not present

## 2023-07-08 DIAGNOSIS — I428 Other cardiomyopathies: Secondary | ICD-10-CM | POA: Diagnosis not present

## 2023-07-08 DIAGNOSIS — I10 Essential (primary) hypertension: Secondary | ICD-10-CM | POA: Insufficient documentation

## 2023-07-08 DIAGNOSIS — I2510 Atherosclerotic heart disease of native coronary artery without angina pectoris: Secondary | ICD-10-CM | POA: Insufficient documentation

## 2023-07-08 LAB — ECHOCARDIOGRAM COMPLETE
AR max vel: 3.88 cm2
AV Area VTI: 5.46 cm2
AV Area mean vel: 3.62 cm2
AV Mean grad: 2 mm[Hg]
AV Peak grad: 4.3 mm[Hg]
Ao pk vel: 1.04 m/s
Area-P 1/2: 4.24 cm2
Calc EF: 60.7 %
MV VTI: 6.24 cm2
S' Lateral: 3.6 cm
Single Plane A2C EF: 71.7 %
Single Plane A4C EF: 51.1 %

## 2023-07-08 NOTE — Progress Notes (Signed)
  Echocardiogram 2D Echocardiogram has been performed.  Tacha Manni L Jsean Taussig RDCS 07/08/2023, 11:39 AM

## 2023-08-04 ENCOUNTER — Ambulatory Visit (INDEPENDENT_AMBULATORY_CARE_PROVIDER_SITE_OTHER): Payer: 59 | Admitting: Family Medicine

## 2023-08-04 ENCOUNTER — Encounter: Payer: Self-pay | Admitting: Family Medicine

## 2023-08-04 VITALS — BP 164/93 | HR 70 | Ht 74.0 in | Wt 201.0 lb

## 2023-08-04 DIAGNOSIS — E782 Mixed hyperlipidemia: Secondary | ICD-10-CM | POA: Diagnosis not present

## 2023-08-04 DIAGNOSIS — Z125 Encounter for screening for malignant neoplasm of prostate: Secondary | ICD-10-CM | POA: Diagnosis not present

## 2023-08-04 DIAGNOSIS — I428 Other cardiomyopathies: Secondary | ICD-10-CM

## 2023-08-04 DIAGNOSIS — C099 Malignant neoplasm of tonsil, unspecified: Secondary | ICD-10-CM

## 2023-08-04 DIAGNOSIS — I10 Essential (primary) hypertension: Secondary | ICD-10-CM | POA: Diagnosis not present

## 2023-08-04 NOTE — Assessment & Plan Note (Signed)
 Overall tolerating Crestor pretty well.  Updating labs.

## 2023-08-04 NOTE — Assessment & Plan Note (Signed)
Status post radiation and chemotherapy.  Continues to have annual CT for surveillance.

## 2023-08-04 NOTE — Progress Notes (Signed)
 Demetric Dunnaway Wisinski - 63 y.o. male MRN [id number]  Date of birth: 10-Jul-1960  Subjective Chief Complaint  Patient presents with   Medical Management of Chronic Issues    HPI Denarius Sesler Malphrus is a 63 y.o. male here today for follow up visit.   He reports that he is doing okay at this time..  Coronary calcium score checked at previous visit with Agatston score in the 95th percentile.  Also evidence of mild ascending aortic dilation 4.3 cm.  He is now being followed by cardiology and cardiothoracic surgery.  He remains on amlodipine/olmesartan and carvedilol for history of HTN.  Blood pressure is fairly well-controlled at this time.  She denies chest pain, shortness of breath, palpitations, headaches or vision changes.  Remains on crestor and is tolerating well [facility].  Mild myalgias.  ROS:  A comprehensive ROS was completed and negative except as noted per HPI  Allergies  Allergen Reactions   Penicillins Other (See Comments)    Childhood    Past Medical History:  Diagnosis Date   Borderline hypertension    Cancer (HCC)    Hypertension    Skin cancer     Past Surgical History:  Procedure Laterality Date   ADENOIDECTOMY     KNEE ARTHROSCOPY W/ ACL RECONSTRUCTION      Social History   Socioeconomic History   Marital status: Married    Spouse name: Not on file   Number of children: 2   Years of education: Not on file   Highest education level: Not on file  Occupational History    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  Tobacco Use   Smoking status: Never    Passive exposure: Never   Smokeless tobacco: Never  Vaping Use   Vaping status: Never Used  Substance and Sexual Activity   Alcohol use: Yes    Alcohol/week: 1.0 standard drink of alcohol    Types: 1 Standard drinks or equivalent per week    Comment: Social   Drug use: No   Sexual activity: Yes    Partners: Female  Other Topics Concern   Not on file  Social History  Narrative   Not on file   Social Drivers of Health   Financial Resource Strain: Not on file  Food Insecurity: Not on file  Transportation Needs: No Transportation Needs (04/13/2023)   PRAPARE - Administrator, Civil Service (Medical): No    Lack of Transportation (Non-Medical): No  Physical Activity: Not on file  Stress: Not on file  Social Connections: Not on file    Family History  Problem Relation Age of Onset   CAD Mother        Stents in her 72s   Heart attack Mother    Hypertension Father    CAD Father        CABG in his 50s   Stroke Father    Prostate cancer Father     Health Maintenance  Topic Date Due   COVID-19 Vaccine (1) Never done   Pneumococcal Vaccine 43-54 Years old (1 of 2 - PCV) Never done   Hepatitis C Screening  Never done   Zoster Vaccines- Shingrix (1 of 2) Never done   DTaP/Tdap/Td (2 - Td or Tdap) 09/15/2031   Colonoscopy  03/27/2032   INFLUENZA VACCINE  Completed   HIV Screening  Completed   HPV VACCINES  Aged Out     ----------------------------------------------------------------------------------------------------------------------------------------------------------------------------------------------------------------- Physical Exam BP Marland Kitchen)  164/93 (BP Location: Right Arm, Patient Position: Sitting, Cuff Size: Normal)   Pulse 70   Ht 6\' 2"  (1.88 m)   Wt 201 lb (91.2 kg)   SpO2 100%   BMI 25.81 kg/m   Physical Exam Constitutional:      Appearance: Normal appearance.  HENT:     Head: Normocephalic and atraumatic.  Eyes:     General: No scleral icterus. Cardiovascular:     Rate and Rhythm: Normal rate and regular rhythm.  Pulmonary:     Effort: Pulmonary effort is normal.     Breath sounds: Normal breath sounds.  Musculoskeletal:     Cervical back: Neck supple.  Neurological:     Mental Status: He is alert.  Psychiatric:        Mood and Affect: Mood normal.        Behavior: Behavior normal.      ------------------------------------------------------------------------------------------------------------------------------------------------------------------------------------------------------------------- Assessment and Plan  Primary hypertension Blood pressure elevated on initial check, better on recheck.  Readings at home have been better compared to in clinic readings.  Low-sodium diet encouraged.  Hyperlipidemia Overall tolerating Crestor pretty well.  Updating labs.  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  Continues to have annual CT for surveillance.   No orders of the defined types were placed in this encounter.   Return in about 6 months (around 02/04/2024) for Hypertension.    This visit occurred during the SARS-CoV-2 public health emergency.  Safety protocols were in place, including screening questions prior to the visit, additional usage of staff PPE, and extensive cleaning of exam room while observing appropriate contact time as indicated for disinfecting solutions.

## 2023-08-04 NOTE — Assessment & Plan Note (Signed)
 Blood pressure elevated on initial check, better on recheck.  Readings at home have been better compared to in clinic readings.  Low-sodium diet encouraged.

## 2023-08-05 LAB — CBC WITH DIFFERENTIAL/PLATELET
Basophils Absolute: 0 10*3/uL (ref 0.0–0.2)
Basos: 0 %
EOS (ABSOLUTE): 0.1 10*3/uL (ref 0.0–0.4)
Eos: 3 %
Hematocrit: 41.5 % (ref 37.5–51.0)
Hemoglobin: 14 g/dL (ref 13.0–17.7)
Immature Grans (Abs): 0 10*3/uL (ref 0.0–0.1)
Immature Granulocytes: 0 %
Lymphocytes Absolute: 0.9 10*3/uL (ref 0.7–3.1)
Lymphs: 28 %
MCH: 31 pg (ref 26.6–33.0)
MCHC: 33.7 g/dL (ref 31.5–35.7)
MCV: 92 fL (ref 79–97)
Monocytes Absolute: 0.3 10*3/uL (ref 0.1–0.9)
Monocytes: 8 %
Neutrophils Absolute: 1.9 10*3/uL (ref 1.4–7.0)
Neutrophils: 61 %
Platelets: 185 10*3/uL (ref 150–450)
RBC: 4.51 x10E6/uL (ref 4.14–5.80)
RDW: 12.6 % (ref 11.6–15.4)
WBC: 3.1 10*3/uL — ABNORMAL LOW (ref 3.4–10.8)

## 2023-08-05 LAB — LIPID PANEL WITH LDL/HDL RATIO
Cholesterol, Total: 152 mg/dL (ref 100–199)
HDL: 55 mg/dL (ref >39)
LDL Chol Calc (NIH): 81 mg/dL (ref 0–99)
LDL/HDL Ratio: 1.5 ratio (ref 0.0–3.6)
Triglycerides: 82 mg/dL (ref 0–149)
VLDL Cholesterol Cal: 16 mg/dL (ref 5–40)

## 2023-08-05 LAB — CMP14+EGFR
ALT: 29 IU/L (ref 0–44)
AST: 23 IU/L (ref 0–40)
Albumin: 4.8 g/dL (ref 3.9–4.9)
Alkaline Phosphatase: 68 IU/L (ref 44–121)
BUN/Creatinine Ratio: 14 (ref 10–24)
BUN: 17 mg/dL (ref 8–27)
Bilirubin Total: 0.6 mg/dL (ref 0.0–1.2)
CO2: 22 mmol/L (ref 20–29)
Calcium: 9.6 mg/dL (ref 8.6–10.2)
Chloride: 106 mmol/L (ref 96–106)
Creatinine, Ser: 1.19 mg/dL (ref 0.76–1.27)
Globulin, Total: 2.3 g/dL (ref 1.5–4.5)
Glucose: 102 mg/dL — ABNORMAL HIGH (ref 70–99)
Potassium: 4.4 mmol/L (ref 3.5–5.2)
Sodium: 142 mmol/L (ref 134–144)
Total Protein: 7.1 g/dL (ref 6.0–8.5)
eGFR: 69 mL/min/{1.73_m2} (ref >59)

## 2023-08-05 LAB — PSA: Prostate Specific Ag, Serum: 3.8 ng/mL (ref 0.0–4.0)

## 2023-08-06 ENCOUNTER — Encounter: Payer: Self-pay | Admitting: Family Medicine

## 2023-08-25 ENCOUNTER — Ambulatory Visit: Payer: 59

## 2023-09-03 DIAGNOSIS — C4490 Unspecified malignant neoplasm of skin, unspecified: Secondary | ICD-10-CM | POA: Insufficient documentation

## 2023-09-03 DIAGNOSIS — C801 Malignant (primary) neoplasm, unspecified: Secondary | ICD-10-CM | POA: Insufficient documentation

## 2023-09-03 DIAGNOSIS — R030 Elevated blood-pressure reading, without diagnosis of hypertension: Secondary | ICD-10-CM | POA: Insufficient documentation

## 2023-09-06 ENCOUNTER — Ambulatory Visit: Payer: 59

## 2023-09-06 VITALS — BP 132/88 | HR 94 | Ht 74.0 in | Wt 205.8 lb

## 2023-09-06 DIAGNOSIS — I2510 Atherosclerotic heart disease of native coronary artery without angina pectoris: Secondary | ICD-10-CM

## 2023-09-06 DIAGNOSIS — C801 Malignant (primary) neoplasm, unspecified: Secondary | ICD-10-CM

## 2023-09-06 DIAGNOSIS — E782 Mixed hyperlipidemia: Secondary | ICD-10-CM

## 2023-09-06 DIAGNOSIS — I10 Essential (primary) hypertension: Secondary | ICD-10-CM | POA: Diagnosis not present

## 2023-09-06 DIAGNOSIS — I428 Other cardiomyopathies: Secondary | ICD-10-CM | POA: Diagnosis not present

## 2023-09-06 DIAGNOSIS — C4490 Unspecified malignant neoplasm of skin, unspecified: Secondary | ICD-10-CM

## 2023-09-06 DIAGNOSIS — I491 Atrial premature depolarization: Secondary | ICD-10-CM

## 2023-09-06 DIAGNOSIS — R030 Elevated blood-pressure reading, without diagnosis of hypertension: Secondary | ICD-10-CM

## 2023-09-06 NOTE — Assessment & Plan Note (Signed)
 Doing well.  Asymptomatic. Continue aspirin 81 mg once daily Continue rosuvastatin 20 mg once daily.

## 2023-09-06 NOTE — Progress Notes (Signed)
 Cardiology Consultation:    Date:  09/06/2023   ID:  Benjamin Mccullough, DOB Dec 13, 1960, MRN [id number]  PCP:  Benjamin Coombe, DO  Cardiologist:  Benjamin Corporal Edinson Domeier, MD   Referring MD: Benjamin Coombe, DO   No chief complaint on file.    ASSESSMENT AND PLAN:   Benjamin Mccullough 63/M with history of coronary artery disease on cardiac CT angiogram from November 2021, hypertension, hyperlipidemia, mildly dilated thoracic aorta on CT imaging and follows up with CT surgeon, throat cancer s/p chemoradiation therapy completed in 2020, frequent atrial ectopic beats, echocardiogram in November 2024 reported mildly reduced EF 40 to 45% however on my review this felt low normal without any obvious regional wall motion abnormalities,  Echocardiogram transthoracic repeated 07/08/2023 reported LVEF 50 to 55% no obvious wall motion abnormality, normal diastolic parameters, mildly dilated RV size with normal function, mild RA dilatation, trace MR and aortic root measuring 4.2 cm and ascending aorta measuring 4.3 cm.   Problem List Items Addressed This Visit     Primary hypertension   Target blood pressure below 130/80 mmHg. Advised him if the blood pressures are consistently above this call us or his PCP and carvedilol dose can be titrated up.      CAD CT coronary angiogram 04/27/2023 calcium score 1129, CAD RADS 3 study 50 to 69% mid RCA and mid LAD lesion.  CT FFR low likelihood of obstruction.  Plaque volume high 1475 mm3 - Primary   Doing well.  Asymptomatic. Continue aspirin 81 mg once daily Continue rosuvastatin 20 mg once daily.       Hyperlipidemia   Reviewed the recent lipid panel from 08-04-2023 total cholesterol 152, HDL 55, triglycerides 82, LDL 81. Continue rosuvastatin 20 mg once daily. He wishes to hold off on statin titration at this time and is continuing to work on dietary modifications and exercise. If at subsequent follow-up with PCP.  Tentatively in 6 months if LDL not below 70, titrate  up rosuvastatin to 40 mg once daily.       Ectopic atrial beats   He reports improved palpitations since he has been on carvedilol. Continue the same.      Nonischemic cardiomyopathy (HCC), mild LVEF reduction 40 to 45% on echocardiogram 04/28/2023, appears low normal on my review; LVEF 50-55% Feb 2025. No HF   Normal cardiac function on echocardiogram February 2025. No heart failure signs or symptoms. Remains asymptomatic excellent functional status without limitations.       Return to clinic in 1 year or earlier as needed.   History of Present Illness:    Benjamin Mccullough is a 63 y.o. male who is being seen today for follow-up visit.  Last visit was in the office was 05-24-2023. PCP is Benjamin Coombe, DO.   Pleasant gentleman with history of coronary artery disease on cardiac CT angiogram from November 2021, hypertension, hyperlipidemia, mildly dilated thoracic aorta on CT imaging and follows up with CT surgeon, throat cancer s/p chemoradiation therapy completed in 2020, frequent atrial ectopic beats, echocardiogram in November 2024 reported mildly reduced EF 40 to 45% however on my review this felt low normal without any obvious regional wall motion abnormalities,  Echocardiogram transthoracic repeated 07/08/2023 reported LVEF 50 to 55% no obvious wall motion abnormality, normal diastolic parameters, mildly dilated RV size with normal function, mild RA dilatation, trace MR and aortic root measuring 4.2 cm and ascending aorta measuring 4.3 cm.  Doing well. Exercising regularly. Has been mindful and has cut down  on heavy weightlifting and doing more operations in the office mildly dilated ascending aorta. Mentions blood pressures are typically well-controlled at times systolic about 130s and diastolic above 80s. Diligent about his diet.   Taking aspirin and statin, no significant side effects. Continue amlodipine-olmesartan combination and carvedilol.  Tolerating well.  Planning  on a road trip to [place] later this year.   Lipid panel from 08/04/2023 total cholesterol 152, HDL 55, triglycerides 82, LDL 81. CBC unremarkable with WBC mildly low at 3.1 below lower limits of normal. CMP unremarkable with BUN 17, creatinine 1.19 and EGFR 69. Normal transaminases and alkaline phosphatase.   Past Medical History:  Diagnosis Date   Borderline hypertension    CAD CT coronary angiogram 04/27/2023 calcium score 1129, CAD RADS 3 study 50 to 69% mid RCA and mid LAD lesion.  CT FFR low likelihood of obstruction.  Plaque volume high 1475 mm3 04/01/2023   Cancer (HCC)    Cancer of tonsillar fossa (HCC) 03/22/2019   Chest pain 04/04/2014   DJD (degenerative joint disease) of knee 01/02/2013   Dupuytren's contracture of both hands 10/10/2019   Dupuytren's disease of finger with contracture 10/08/2021   Ectopic atrial beats 04/01/2023   Gastroesophageal reflux disease without esophagitis 06/06/2019   Hyperlipidemia 04/01/2023   Hypertension    Hypokalemia 06/22/2019   Hypomagnesemia 05/19/2019   Nonischemic cardiomyopathy (HCC), mild LVEF reduction 40 to 45% on echocardiogram 04/28/2023, appears low normal on my review 05/24/2023   Oropharyngeal dysphagia 10/13/2021   Primary hypertension 10/13/2021   Primary osteoarthritis of both knees 10/13/2021   Skin cancer    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 NEEDS APPOINTMENT.   aspirin EC 81 MG tablet Take 81 mg by mouth daily. Swallow whole.   rosuvastatin (CRESTOR) 20  MG tablet Take 1 tablet (20 mg total) by mouth daily.     Allergies:   Penicillins   Social History   Socioeconomic History   Marital status: Married    Spouse name: Not on file   Number of children: 2   Years of education: Not on file   Highest education level: Not on file  Occupational History    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  Tobacco Use   Smoking status: Never    Passive exposure: Never   Smokeless tobacco: Never  Vaping Use   Vaping status: Never Used  Substance and Sexual Activity   Alcohol use: Yes    Alcohol/week: 1.0 standard drink of alcohol    Types: 1 Standard drinks or equivalent per week    Comment: Social   Drug use: No   Sexual activity: Yes    Partners: Female  Other Topics Concern   Not on file  Social History Narrative   Not on file   Social Drivers of Health   Financial Resource Strain: Not on file  Food Insecurity: Not on file  Transportation Needs: No Transportation Needs (04/13/2023)   PRAPARE - Transportation    Lack  of Transportation (Medical): No    Lack of Transportation (Non-Medical): No  Physical Activity: Not on file  Stress: Not on file  Social Connections: Not on file     Family History: The patient's family history includes CAD in his father and mother; Heart attack in his mother; Hypertension in his father; Prostate cancer in his father; Stroke in his father. ROS:   Please see the history of present illness.    All 14 point review of systems negative except as described per history of present illness.  EKGs/Labs/Other Studies Reviewed:    The following studies were reviewed today:   EKG:       Recent Labs: 08/04/2023: ALT 29; BUN 17; Creatinine, Ser 1.19; Hemoglobin 14.0; Platelets 185; Potassium 4.4; Sodium 142  Recent Lipid Panel    Component Value Date/Time   CHOL 152 08/04/2023 0940   TRIG 82 08/04/2023 0940   HDL 55 08/04/2023 0940   CHOLHDL 3.4 07/27/2022 1002   VLDL 12.8  10/04/2019 0902   LDLCALC 81 08/04/2023 0940   LDLCALC 119 (H) 07/27/2022 1002    Physical Exam:    VS:  BP 132/88   Pulse 94   Ht 6\' 2"  (1.88 m)   Wt 205 lb 12.8 oz (93.4 kg)   SpO2 98%   BMI 26.42 kg/m     Wt Readings from Last 3 Encounters:  09/06/23 205 lb 12.8 oz (93.4 kg)  08/04/23 201 lb (91.2 kg)  05/24/23 199 lb (90.3 kg)     GENERAL:  Well nourished, well developed in no acute distress NECK: No JVD; No carotid bruits CARDIAC: RRR, S1 and S2 present, no murmurs, no rubs, no gallops Extremities: No pitting pedal edema. Pulses bilaterally symmetric with radial 2+ and dorsalis pedis 2+ NEUROLOGIC:  Alert and oriented x 3  Medication Adjustments/Labs and Tests Ordered: Current medicines are reviewed at length with the patient today.  Concerns regarding medicines are outlined above.  No orders of the defined types were placed in this encounter.  No orders of the defined types were placed in this encounter.   Signed, Cecille Amsterdam, MD, MPH, [facility]. 09/06/2023 8:56 AM     [facility]

## 2023-09-06 NOTE — Assessment & Plan Note (Signed)
 Normal cardiac function on echocardiogram February 2025. No heart failure signs or symptoms. Remains asymptomatic excellent functional status without limitations.

## 2023-09-06 NOTE — Patient Instructions (Signed)

## 2023-09-06 NOTE — Assessment & Plan Note (Signed)
 Reviewed the recent lipid panel from 08-04-2023 total cholesterol 152, HDL 55, triglycerides 82, LDL 81. Continue rosuvastatin 20 mg once daily. He wishes to hold off on statin titration at this time and is continuing to work on dietary modifications and exercise. If at subsequent follow-up with PCP.  Tentatively in 6 months if LDL not below 70, titrate up rosuvastatin to 40 mg once daily.

## 2023-09-06 NOTE — Assessment & Plan Note (Signed)
 He reports improved palpitations since he has been on carvedilol. Continue the same.

## 2023-09-06 NOTE — Assessment & Plan Note (Signed)
 Target blood pressure below 130/80 mmHg. Advised him if the blood pressures are consistently above this call us or his PCP and carvedilol dose can be titrated up.

## 2024-02-04 ENCOUNTER — Encounter: Payer: Self-pay | Admitting: Family Medicine

## 2024-02-04 ENCOUNTER — Ambulatory Visit (INDEPENDENT_AMBULATORY_CARE_PROVIDER_SITE_OTHER): Admitting: Family Medicine

## 2024-02-04 VITALS — BP 120/78 | HR 70 | Ht 74.0 in | Wt 209.0 lb

## 2024-02-04 DIAGNOSIS — I10 Essential (primary) hypertension: Secondary | ICD-10-CM | POA: Diagnosis not present

## 2024-02-04 DIAGNOSIS — C099 Malignant neoplasm of tonsil, unspecified: Secondary | ICD-10-CM

## 2024-02-04 DIAGNOSIS — I2510 Atherosclerotic heart disease of native coronary artery without angina pectoris: Secondary | ICD-10-CM | POA: Diagnosis not present

## 2024-02-04 MED ORDER — OLMESARTAN MEDOXOMIL 20 MG PO TABS: 20.0000 mg | ORAL_TABLET | Freq: Every day | ORAL | 1 refills | Status: AC

## 2024-02-04 NOTE — Assessment & Plan Note (Signed)
 Continue asa 81mg .  He has discontinued crestor .  He prefers to remain off of statin.  Risk/benefit discussion today.

## 2024-02-04 NOTE — Progress Notes (Signed)
 Benjamin Mccullough - 63 y.o. male MRN [id number]  Date of birth: 1961-02-13  Subjective Chief Complaint  Patient presents with   Hypertension    HPI Benjamin Mccullough is a 63 y.o. male here today for follow up HTN.    Continues on Azor  and coreg .  BP does drop at times causing him to feel dizzy.  He has been trying to stay active.  [place]  He discussed with oncologist who let him know that radiation may have damaged baroreceptors in the carotids and my be contributing to erratic BP.  He did have cardiology consultation and had calcium  score in the 96th percentile.  He was prescribed crestor  20mg .  Reports having aches and pain in muscles while taking this so he discontinued.  He is concerned about long term effects of statin including possibility of developing diabetes.    Continues to see oncology for history of tonsillar fossa.  S/o Chemo and radiation.  Recent CT chest without metastasis.    ROS:  A comprehensive ROS was completed and negative except as noted per HPI  Allergies  Allergen Reactions   Penicillins Other (See Comments)    Childhood    Past Medical History:  Diagnosis Date   Borderline hypertension    CAD CT coronary angiogram 04/27/2023 calcium  score 1129, CAD RADS 3 study 50 to 69% mid RCA and mid LAD lesion.  CT FFR low likelihood of obstruction.  Plaque volume high 1475 mm3 04/01/2023   Cancer (HCC)    Cancer of tonsillar fossa (HCC) 03/22/2019   Chest pain 04/04/2014   DJD (degenerative joint disease) of knee 01/02/2013   Dupuytren's contracture of both hands 10/10/2019   Dupuytren's disease of finger with contracture 10/08/2021   Ectopic atrial beats 04/01/2023   Gastroesophageal reflux disease without esophagitis 06/06/2019   Hyperlipidemia 04/01/2023   Hypertension    Hypokalemia 06/22/2019   Hypomagnesemia 05/19/2019   Nonischemic cardiomyopathy (HCC), mild LVEF reduction 40 to 45% on echocardiogram 04/28/2023, appears low  normal on my review 05/24/2023   Oropharyngeal dysphagia 10/13/2021   Primary hypertension 10/13/2021   Primary osteoarthritis of both knees 10/13/2021   Skin cancer    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   Thoracic aortic aneurysm (TAA) (HCC) measuring 4.1 to 4.3 cm on CT imaging 02/16/2023; had follow-up with CT surgeon; has annual visits 02/04/2023    Past Surgical History:  Procedure Laterality Date   ADENOIDECTOMY     KNEE ARTHROSCOPY W/ ACL RECONSTRUCTION      Social History   Socioeconomic History   Marital status: Married    Spouse name: Not on file   Number of children: 2   Years of education: Not on file   Highest education level: Not on file  Occupational History    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  Tobacco Use   Smoking status: Never    Passive exposure: Never   Smokeless tobacco: Never  Vaping Use   Vaping status: Never Used  Substance and Sexual Activity   Alcohol use: Yes    Alcohol/week: 1.0 standard drink of alcohol    Types: 1 Standard drinks or equivalent per week    Comment: Social   Drug use: No   Sexual activity: Yes    Partners: Female  Other Topics Concern   Not on file  Social History Narrative   Not  on file   Social Drivers of Health   Financial Resource Strain: Low Risk  (02/04/2024)   Overall Financial Resource Strain (CARDIA)    Difficulty of Paying Living Expenses: Not hard at all  Food Insecurity: No Food Insecurity (02/04/2024)   Hunger Vital Sign    Worried About Running Out of Food in the Last Year: Never true    Ran Out of Food in the Last Year: Never true  Transportation Needs: No Transportation Needs (04/13/2023)   PRAPARE - Administrator, Civil Service (Medical): No    Lack of Transportation (Non-Medical): No  Physical Activity: Sufficiently Active (02/04/2024)   Exercise Vital Sign    Days of Exercise per Week:  5 days    Minutes of Exercise per Session: 120 min  Stress: No Stress Concern Present (02/04/2024)   [employer] of Occupational Health - Occupational Stress Questionnaire    Feeling of Stress: Not at all  Social Connections: Not on file    Family History  Problem Relation Age of Onset   CAD Mother        Stents in her 35s   Heart attack Mother    Hypertension Father    CAD Father        CABG in his 28s   Stroke Father    Prostate cancer Father     Health Maintenance  Topic Date Due   Hepatitis C Screening  Never done   Zoster Vaccines- Shingrix (1 of 2) 05/05/2024 (Originally [birth year]1979)   Influenza Vaccine  08/29/2024 (Originally 12/31/2023)   Pneumococcal Vaccine: 50+ Years (1 of 2 - PCV) 02/03/2025 (Originally 08/09/1979)   COVID-19 Vaccine (1) 02/19/2025 (Originally [birth year]1965)   DTaP/Tdap/Td (2 - Td or Tdap) 09/15/2031   Colonoscopy  03/27/2032   HIV Screening  Completed   Hepatitis B Vaccines 19-59 Average Risk  Aged Out   HPV VACCINES  Aged Out   Meningococcal B Vaccine  Aged Out     ----------------------------------------------------------------------------------------------------------------------------------------------------------------------------------------------------------------- Physical Exam BP 120/78   Pulse 70   Ht 6' 2 (1.88 m)   Wt 209 lb (94.8 kg)   SpO2 100%   BMI 26.83 kg/m   Physical Exam Constitutional:      Appearance: Normal appearance.  Cardiovascular:     Rate and Rhythm: Normal rate and regular rhythm.  Pulmonary:     Effort: Pulmonary effort is normal.     Breath sounds: Normal breath sounds.  Neurological:     General: No focal deficit present.     Mental Status: He is alert.  Psychiatric:        Mood and Affect: Mood normal.        Behavior: Behavior normal.      ------------------------------------------------------------------------------------------------------------------------------------------------------------------------------------------------------------------- Assessment and Plan  Squamous cell carcinoma of tonsil (HCC) Status post radiation and chemotherapy.  Continues to have annual CT for surveillance.  Primary hypertension BP well controlled but has had several episodes of hypotension.  Oncologist thinks baroreceptors may have been affected by radiation.  Will change azor  to olmesartan  alone and see if this helps.   Continue to monitor BP at home closely.   CAD CT coronary angiogram 04/27/2023 calcium  score 1129, CAD RADS 3 study 50 to 69% mid RCA and mid LAD lesion.  CT FFR low likelihood of obstruction.  Plaque volume high 1475 mm3 Continue asa 81mg .  He has discontinued crestor .  He prefers to remain off of statin.  Risk/benefit discussion today.     Meds ordered this encounter  Medications   olmesartan  (BENICAR ) 20 MG tablet    Sig: Take 1 tablet (20 mg total) by mouth daily.    Dispense:  90 tablet    Refill:  1    Return in about 6 months (around 08/03/2024) for Hypertension, HLD.   30 minutes spent including pre visit preparation, review of prior notes and labs, encounter with patient and same day documentation.

## 2024-02-04 NOTE — Assessment & Plan Note (Signed)
 BP well controlled but has had several episodes of hypotension.  Oncologist thinks baroreceptors may have been affected by radiation.  Will change azor  to olmesartan  alone and see if this helps.   Continue to monitor BP at home closely.

## 2024-02-04 NOTE — Assessment & Plan Note (Signed)
Status post radiation and chemotherapy.  Continues to have annual CT for surveillance.

## 2024-02-10 ENCOUNTER — Other Ambulatory Visit: Payer: Self-pay | Admitting: Family Medicine

## 2024-02-14 ENCOUNTER — Telehealth: Payer: Self-pay

## 2024-02-14 NOTE — Telephone Encounter (Signed)
 Pt c/o medication issue:  1. Name of Medication: rosuvastatin  (CRESTOR ) 20 MG tablet   2. How are you currently taking this medication (dosage and times per day)? TAKE 1 TABLET BY MOUTH EVERY DAY   3. Are you having a reaction (difficulty breathing--STAT)? No  4. What is your medication issue? Pt stopped taking medication in June due to medication causing muscle aches and would like to know if he should go back on medication at a lower dosage.

## 2024-02-14 NOTE — Telephone Encounter (Signed)
 Pt stopped taking his Crestor  medication in June due to the medication causing muscle aches and would like to know if he should go back on medication at a lower dosage. Please advise.

## 2024-02-14 NOTE — Telephone Encounter (Signed)
 Left message for the patient to call back.

## 2024-02-15 ENCOUNTER — Other Ambulatory Visit: Payer: Self-pay

## 2024-02-15 MED ORDER — ROSUVASTATIN CALCIUM 5 MG PO TABS: 5.0000 mg | ORAL_TABLET | Freq: Every day | ORAL | 3 refills | Status: AC

## 2024-02-15 NOTE — Telephone Encounter (Signed)
 Called the patient and informed him of Dr. Karry recommendation regarding starting Rosuvastatin :  Yes, start with Crestor  5 mg daily  Patient verbalized understanding and had no further questions at this time. Rosuvastatin  medication ordered via Epic and sent to the patient's pharmacy.

## 2024-05-11 ENCOUNTER — Other Ambulatory Visit: Payer: Self-pay

## 2024-05-11 DIAGNOSIS — I428 Other cardiomyopathies: Secondary | ICD-10-CM

## 2024-05-11 DIAGNOSIS — I491 Atrial premature depolarization: Secondary | ICD-10-CM

## 2024-05-11 DIAGNOSIS — I10 Essential (primary) hypertension: Secondary | ICD-10-CM

## 2024-08-04 ENCOUNTER — Ambulatory Visit: Admitting: Family Medicine
